# Patient Record
Sex: Male | Born: 1970
Health system: Southern US, Community
[De-identification: ages and names within clinical notes are randomized; demographics above are authoritative.]

## PROBLEM LIST (undated history)

## (undated) DIAGNOSIS — R030 Elevated blood-pressure reading, without diagnosis of hypertension: Secondary | ICD-10-CM

## (undated) DIAGNOSIS — E7800 Pure hypercholesterolemia, unspecified: Secondary | ICD-10-CM

## (undated) DIAGNOSIS — Z720 Tobacco use: Secondary | ICD-10-CM

## (undated) HISTORY — DX: Pure hypercholesterolemia, unspecified: E78.00

## (undated) HISTORY — DX: Elevated blood-pressure reading, without diagnosis of hypertension: R03.0

## (undated) HISTORY — DX: Tobacco use: Z72.0

## (undated) HISTORY — PX: NO PAST SURGERIES: SHX2092

---

## 2000-04-02 ENCOUNTER — Emergency Department (HOSPITAL_COMMUNITY): Admission: EM | Admit: 2000-04-02 | Discharge: 2000-04-02 | Payer: Self-pay | Admitting: Emergency Medicine

## 2005-08-24 ENCOUNTER — Inpatient Hospital Stay: Payer: Self-pay | Admitting: Internal Medicine

## 2006-04-10 ENCOUNTER — Emergency Department: Payer: Self-pay | Admitting: Emergency Medicine

## 2008-07-07 ENCOUNTER — Emergency Department: Payer: Self-pay | Admitting: Emergency Medicine

## 2012-10-31 ENCOUNTER — Emergency Department: Payer: Self-pay | Admitting: Emergency Medicine

## 2016-03-01 ENCOUNTER — Emergency Department: Payer: Self-pay

## 2016-03-01 ENCOUNTER — Emergency Department
Admission: EM | Admit: 2016-03-01 | Discharge: 2016-03-01 | Disposition: A | Discharge status: Home / Self Care | Payer: Self-pay | Attending: Emergency Medicine | Admitting: Emergency Medicine

## 2016-03-01 DIAGNOSIS — Y9389 Activity, other specified: Secondary | ICD-10-CM | POA: Insufficient documentation

## 2016-03-01 DIAGNOSIS — W208XXA Other cause of strike by thrown, projected or falling object, initial encounter: Secondary | ICD-10-CM | POA: Insufficient documentation

## 2016-03-01 DIAGNOSIS — Y999 Unspecified external cause status: Secondary | ICD-10-CM | POA: Insufficient documentation

## 2016-03-01 DIAGNOSIS — S60212A Contusion of left wrist, initial encounter: Secondary | ICD-10-CM | POA: Insufficient documentation

## 2016-03-01 DIAGNOSIS — Y929 Unspecified place or not applicable: Secondary | ICD-10-CM | POA: Insufficient documentation

## 2016-03-01 MED ORDER — OXYCODONE-ACETAMINOPHEN 5-325 MG PO TABS: 1.0000 | ORAL_TABLET | ORAL | 0 refills | Status: DC | PRN

## 2016-03-01 MED ORDER — OXYCODONE-ACETAMINOPHEN 5-325 MG PO TABS
1.0000 | ORAL_TABLET | Freq: Once | ORAL | Status: AC
  Administered 2016-03-01: 1 via ORAL
  Filled 2016-03-01: qty 1

## 2016-03-01 NOTE — ED Triage Notes (Signed)
Patient ambulatory to triage with steady gait, without difficulty or distress noted; pt reports injuring left wrist moving a dresser PTA; swelling noted

## 2016-03-01 NOTE — ED Provider Notes (Signed)
[facility] Emergency Department Provider Note  ____________________________________________   First MD Initiated Contact with Patient 03/01/16 [phone]     (approximate)  I have reviewed the triage vital signs and the nursing notes.   HISTORY  Chief Complaint Wrist Injury    HPI Bill Hernandez is a 45 y.o. male presents with left wrist pain swelling status post injury while moving a dresser. Patient states that the draw in the dresser fell onto his left wrist with resultant pain and swelling. Patient states his current pain score is 9 out of 10 worse with movement.   Past medical history None There are no active problems to display for this patient.   Past surgical history None  Prior to Admission medications   Not on File    Allergies Review of patient's allergies indicates no known allergies.  No family history on file.  Social History Social History  Substance Use Topics  . Smoking status: Not on file  . Smokeless tobacco: Not on file  . Alcohol use Not on file    Review of Systems Constitutional: No fever/chills Eyes: No visual changes. ENT: No sore throat. Cardiovascular: Denies chest pain. Respiratory: Denies shortness of breath. Gastrointestinal: No abdominal pain.  No nausea, no vomiting.  No diarrhea.  No constipation. Genitourinary: Negative for dysuria. Musculoskeletal: Negative for back pain.Positive for left wrist pain and swelling Skin: Negative for rash. Neurological: Negative for headaches, focal weakness or numbness.  10-point ROS otherwise negative.  ____________________________________________   PHYSICAL EXAM:  VITAL SIGNS: ED Triage Vitals  Enc Vitals Group     BP 03/01/16 0201 (!) 153/86     Pulse Rate 03/01/16 0201 92     Resp 03/01/16 0201 20     Temp 03/01/16 0201 99 F (37.2 C)     Temp Source 03/01/16 0201 Oral     SpO2 03/01/16 0201 99 %     Weight 03/01/16 0144 200 lb (90.7 kg)   Height 03/01/16 0144 6' (1.829 m)     Head Circumference --      Peak Flow --      Pain Score 03/01/16 0144 9     Pain Loc --      Pain Edu? --      Excl. in GC? --     Constitutional: Alert and oriented. Well appearing and in no acute distress. Eyes: Conjunctivae are normal. PERRL. EOMI. Head: Atraumatic. Mouth/Throat: Mucous membranes are moist.  Oropharynx non-erythematous. Neck: No stridor.  No meningeal signs.  Cardiovascular: Normal rate, regular rhythm. Good peripheral circulation. Grossly normal heart sounds. Respiratory: Normal respiratory effort.  No retractions. Lungs CTAB. Gastrointestinal: Soft and nontender. No distention.  Musculoskeletal: No lower extremity tenderness nor edema. No gross deformities of extremities.Left wrist abrasion swelling tenderness to palpation. Tenderness with palpation of the anatomical snuffbox Neurologic:  Normal speech and language. No gross focal neurologic deficits are appreciated.  Skin:  Skin is warm, dry and intact. No rash noted. Psychiatric: Mood and affect are normal. Speech and behavior are normal.   RADIOLOGY I,  N Hady Niemczyk, personally viewed and evaluated these images (plain radiographs) as part of my medical decision making, as well as reviewing the written report by the radiologist. Dg Wrist Complete Left  Result Date: 03/01/2016 CLINICAL DATA:  45 y/o  M; left wrist injury moving a dresser. EXAM: LEFT WRIST - COMPLETE 3+ VIEW COMPARISON:  None. FINDINGS: There is no evidence of fracture or dislocation. There is no evidence of  arthropathy or other focal bone abnormality. Soft tissues are unremarkable. IMPRESSION: Negative. Electronically Signed   By: Mitzi HansenLance  Furusawa-Stratton M.D.   On: 03/01/2016 02:27     Procedures     INITIAL IMPRESSION / ASSESSMENT AND PLAN / ED COURSE  Pertinent labs & imaging results that were available during my care of the patient were reviewed by me and considered in my medical decision making  (see chart for details).  Patient given a Percocet. Velcro wrist splint applied by ED tech.I encouraged patient to follow up with Dr. Truett PernaMentz given possibly of occult fracture of the scaphoid.   Clinical Course    ____________________________________________  FINAL CLINICAL IMPRESSION(S) / ED DIAGNOSES  Final diagnoses:  Contusion of left wrist, initial encounter     MEDICATIONS GIVEN DURING THIS VISIT:  Medications - No data to display   NEW OUTPATIENT MEDICATIONS STARTED DURING THIS VISIT:  New Prescriptions   No medications on file      Note:  This document was prepared using Dragon voice recognition software and may include unintentional dictation errors.    Darci Currentandolph N Kristian Mogg, MD 03/01/16 [phone]

## 2016-09-28 ENCOUNTER — Encounter: Payer: Self-pay | Admitting: Nurse Practitioner

## 2016-09-28 ENCOUNTER — Ambulatory Visit (INDEPENDENT_AMBULATORY_CARE_PROVIDER_SITE_OTHER): Payer: Managed Care, Other (non HMO) | Admitting: Nurse Practitioner

## 2016-09-28 VITALS — BP 132/92 | HR 85 | Temp 97.9°F | Resp 16 | Ht 72.0 in | Wt 194.0 lb

## 2016-09-28 DIAGNOSIS — Z7689 Persons encountering health services in other specified circumstances: Secondary | ICD-10-CM

## 2016-09-28 DIAGNOSIS — Z0000 Encounter for general adult medical examination without abnormal findings: Secondary | ICD-10-CM

## 2016-09-28 DIAGNOSIS — Z720 Tobacco use: Secondary | ICD-10-CM | POA: Diagnosis not present

## 2016-09-28 DIAGNOSIS — R030 Elevated blood-pressure reading, without diagnosis of hypertension: Secondary | ICD-10-CM | POA: Diagnosis not present

## 2016-09-28 HISTORY — DX: Tobacco use: Z72.0

## 2016-09-28 LAB — CBC WITH DIFFERENTIAL/PLATELET
Basophils Absolute: 0 cells/uL (ref 0–200)
Basophils Relative: 0 %
Eosinophils Absolute: 261 cells/uL (ref 15–500)
Eosinophils Relative: 3 %
HCT: 43.5 % (ref 38.5–50.0)
Hemoglobin: 14.4 g/dL (ref 13.2–17.1)
Lymphocytes Relative: 32 %
Lymphs Abs: 2784 cells/uL (ref 850–3900)
MCH: 27.1 pg (ref 27.0–33.0)
MCHC: 33.1 g/dL (ref 32.0–36.0)
MCV: 81.8 fL (ref 80.0–100.0)
MPV: 11.5 fL (ref 7.5–12.5)
Monocytes Absolute: 609 cells/uL (ref 200–950)
Monocytes Relative: 7 %
Neutro Abs: 5046 cells/uL (ref 1500–7800)
Neutrophils Relative %: 58 %
Platelets: 194 10*3/uL (ref 140–400)
RBC: 5.32 MIL/uL (ref 4.20–5.80)
RDW: 13.1 % (ref 11.0–15.0)
WBC: 8.7 10*3/uL (ref 3.8–10.8)

## 2016-09-28 LAB — LIPID PANEL
Cholesterol: 196 mg/dL (ref <200)
HDL: 44 mg/dL (ref >40)
LDL Cholesterol: 122 mg/dL — ABNORMAL HIGH (ref <100)
Total CHOL/HDL Ratio: 4.5 Ratio (ref <5.0)
Triglycerides: 152 mg/dL — ABNORMAL HIGH (ref <150)
VLDL: 30 mg/dL (ref <30)

## 2016-09-28 LAB — COMPLETE METABOLIC PANEL WITH GFR
ALT: 21 U/L (ref 9–46)
AST: 24 U/L (ref 10–40)
Albumin: 4.8 g/dL (ref 3.6–5.1)
Alkaline Phosphatase: 67 U/L (ref 40–115)
BUN: 14 mg/dL (ref 7–25)
CO2: 26 mmol/L (ref 20–31)
Calcium: 9.3 mg/dL (ref 8.6–10.3)
Chloride: 105 mmol/L (ref 98–110)
Creat: 1.02 mg/dL (ref 0.60–1.35)
GFR, Est African American: >89 mL/min (ref >=60)
GFR, Est Non African American: 87 mL/min (ref >=60)
Glucose, Bld: 85 mg/dL (ref 65–99)
Potassium: 3.9 mmol/L (ref 3.5–5.3)
Sodium: 140 mmol/L (ref 135–146)
Total Bilirubin: 0.6 mg/dL (ref 0.2–1.2)
Total Protein: 7.2 g/dL (ref 6.1–8.1)

## 2016-09-28 LAB — TSH: TSH: 1.6 mIU/L (ref 0.40–4.50)

## 2016-09-28 NOTE — Progress Notes (Signed)
I have reviewed this encounter including the documentation in this note and/or discussed this patient with the provider, Wilhelmina McardleLauren Kennedy, AGPCNP-BC. I am certifying that I agree with the content of this note as supervising physician.  Saralyn PilarAlexander Mariella Blackwelder, DO [facility] 09/28/2016, 3:13 PM

## 2016-09-28 NOTE — Patient Instructions (Signed)
Bill Hernandez, Thank you for coming in to clinic today.  1. Annual Physical - We have sent blood work today.  - Return in 1 year for your next annual physical.  2. High Blood Pressure - Your blood pressure goals are less than 130/90.  Check your BP daily for 1 week at home.  Then check it once per week and see if your diet changes help to lower your BP. - Start making some diet changes.  Following the DASH diet will help lower your blood pressure.  Choose one or two things to change this week.  Then keep adding them until your diet is mostly like the DASH diet all the time.  Please schedule a follow-up appointment with Wilhelmina Mcardle, AGNP in 1 year.  If you have any other questions or concerns, please feel free to call the clinic or send a message through MyChart. You may also schedule an earlier appointment if necessary.  Wilhelmina Mcardle, DNP, AGNP-BC Adult Gerontology Nurse Practitioner [facility]    DASH Eating Plan DASH stands for "Dietary Approaches to Stop Hypertension." The DASH eating plan is a healthy eating plan that has been shown to reduce high blood pressure (hypertension). It may also reduce your risk for type 2 diabetes, heart disease, and stroke. The DASH eating plan may also help with weight loss. What are tips for following this plan? General guidelines   Avoid eating more than 2,300-2,500 mg (milligrams) of salt (sodium) a day. If you have hypertension, you may need to reduce your sodium intake to 1,500 mg a day.  Limit alcohol intake to no more than 1 drink a day for nonpregnant women and 2 drinks a day for men. One drink equals 12 oz of beer, 5 oz of wine, or 1 oz of hard liquor.  Work with your health care provider to maintain a healthy body weight or to lose weight. Ask what an ideal weight is for you.  Get at least 30 minutes of exercise that causes your heart to beat faster (aerobic exercise) most days of the week. Activities may include  walking, swimming, or biking.  Work with your health care provider or diet and nutrition specialist (dietitian) to adjust your eating plan to your individual calorie needs. Reading food labels   Check food labels for the amount of sodium per serving. Choose foods with less than 5 percent of the Daily Value of sodium. Generally, foods with less than 300 mg of sodium per serving fit into this eating plan.  To find whole grains, look for the word "whole" as the first word in the ingredient list. Shopping   Buy products labeled as "low-sodium" or "no salt added."  Buy fresh foods. Avoid canned foods and premade or frozen meals. Cooking   Avoid adding salt when cooking. Use salt-free seasonings or herbs instead of table salt or sea salt. Check with your health care provider or pharmacist before using salt substitutes.  Do not fry foods. Cook foods using healthy methods such as baking, boiling, grilling, and broiling instead.  Cook with heart-healthy oils, such as olive, canola, soybean, or sunflower oil. Meal planning    Eat a balanced diet that includes:  5 or more servings of fruits and vegetables each day. At each meal, try to fill half of your plate with fruits and vegetables.  Up to 6-8 servings of whole grains each day.  Less than 6 oz of lean meat, poultry, or fish each day. A 3-oz serving  of meat is about the same size as a deck of cards. One egg equals 1 oz.  2 servings of low-fat dairy each day.  A serving of nuts, seeds, or beans 5 times each week.  Heart-healthy fats. Healthy fats called Omega-3 fatty acids are found in foods such as flaxseeds and coldwater fish, like sardines, salmon, and mackerel.  Limit how much you eat of the following:  Canned or prepackaged foods.  Food that is high in trans fat, such as fried foods.  Food that is high in saturated fat, such as fatty meat.  Sweets, desserts, sugary drinks, and other foods with added sugar.  Full-fat dairy  products.  Do not salt foods before eating.  Try to eat at least 2 vegetarian meals each week.  Eat more home-cooked food and less restaurant, buffet, and fast food.  When eating at a restaurant, ask that your food be prepared with less salt or no salt, if possible. What foods are recommended? The items listed may not be a complete list. Talk with your dietitian about what dietary choices are best for you. Grains  Whole-grain or whole-wheat bread. Whole-grain or whole-wheat pasta. Brown rice. Orpah Cobb. Bulgur. Whole-grain and low-sodium cereals. Pita bread. Low-fat, low-sodium crackers. Whole-wheat flour tortillas. Vegetables  Fresh or frozen vegetables (raw, steamed, roasted, or grilled). Low-sodium or reduced-sodium tomato and vegetable juice. Low-sodium or reduced-sodium tomato sauce and tomato paste. Low-sodium or reduced-sodium canned vegetables. Fruits  All fresh, dried, or frozen fruit. Canned fruit in natural juice (without added sugar). Meat and other protein foods  Skinless chicken or [place]. Ground chicken or [place]. Pork with fat trimmed off. Fish and seafood. Egg whites. Dried beans, peas, or lentils. Unsalted nuts, nut butters, and seeds. Unsalted canned beans. Lean cuts of beef with fat trimmed off. Low-sodium, lean deli meat. Dairy  Low-fat (1%) or fat-free (skim) milk. Fat-free, low-fat, or reduced-fat cheeses. Nonfat, low-sodium ricotta or cottage cheese. Low-fat or nonfat yogurt. Low-fat, low-sodium cheese. Fats and oils  Soft margarine without trans fats. Vegetable oil. Low-fat, reduced-fat, or light mayonnaise and salad dressings (reduced-sodium). Canola, safflower, olive, soybean, and sunflower oils. Avocado. Seasoning and other foods  Herbs. Spices. Seasoning mixes without salt. Unsalted popcorn and pretzels. Fat-free sweets. What foods are not recommended? The items listed may not be a complete list. Talk with your dietitian about what dietary choices are best  for you. Grains  Baked goods made with fat, such as croissants, muffins, or some breads. Dry pasta or rice meal packs. Vegetables  Creamed or fried vegetables. Vegetables in a cheese sauce. Regular canned vegetables (not low-sodium or reduced-sodium). Regular canned tomato sauce and paste (not low-sodium or reduced-sodium). Regular tomato and vegetable juice (not low-sodium or reduced-sodium). Rosita Fire. Olives. Fruits  Canned fruit in a light or heavy syrup. Fried fruit. Fruit in cream or butter sauce. Meat and other protein foods  Fatty cuts of meat. Ribs. Fried meat. Tomasa Blase. Sausage. Bologna and other processed lunch meats. Salami. Fatback. Hotdogs. Bratwurst. Salted nuts and seeds. Canned beans with added salt. Canned or smoked fish. Whole eggs or egg yolks. Chicken or [place] with skin. Dairy  Whole or 2% milk, cream, and half-and-half. Whole or full-fat cream cheese. Whole-fat or sweetened yogurt. Full-fat cheese. Nondairy creamers. Whipped toppings. Processed cheese and cheese spreads. Fats and oils  Butter. Stick margarine. Lard. Shortening. Ghee. Bacon fat. Tropical oils, such as coconut, palm kernel, or palm oil. Seasoning and other foods  Salted popcorn and pretzels. Onion salt, garlic  salt, seasoned salt, table salt, and sea salt. Worcestershire sauce. Tartar sauce. Barbecue sauce. Teriyaki sauce. Soy sauce, including reduced-sodium. Steak sauce. Canned and packaged gravies. Fish sauce. Oyster sauce. Cocktail sauce. Horseradish that you find on the shelf. Ketchup. Mustard. Meat flavorings and tenderizers. Bouillon cubes. Hot sauce and Tabasco sauce. Premade or packaged marinades. Premade or packaged taco seasonings. Relishes. Regular salad dressings. Where to find more information:  National Heart, Lung, and Blood Institute: PopSteam.iswww.nhlbi.nih.gov  American Heart Association: www.heart.org Summary  The DASH eating plan is a healthy eating plan that has been shown to reduce high blood  pressure (hypertension). It may also reduce your risk for type 2 diabetes, heart disease, and stroke.  With the DASH eating plan, you should limit salt (sodium) intake to 2,300 mg a day. If you have hypertension, you may need to reduce your sodium intake to 1,500 mg a day.  When on the DASH eating plan, aim to eat more fresh fruits and vegetables, whole grains, lean proteins, low-fat dairy, and heart-healthy fats.  Work with your health care provider or diet and nutrition specialist (dietitian) to adjust your eating plan to your individual calorie needs. This information is not intended to replace advice given to you by your health care provider. Make sure you discuss any questions you have with your health care provider. Document Released: 06/15/2011 Document Revised: 06/19/2016 Document Reviewed: 06/19/2016 Elsevier Interactive Patient Education  2017 [employer]

## 2016-09-28 NOTE — Progress Notes (Signed)
Subjective:    Patient ID: Bill Hernandez, male    DOB: 10-31-1970, 46 y.o.   MRN: [id number]  Bill MajesticKendrick E Hegna is a 46 y.o. male presenting on 09/28/2016 for Establish Care (as per pt wants physical)   HPI Bill Hernandez is presenting today for establishing care with a new provider and to obtain an annual physical exam with documentation for his employer.  He reports no acute concerns.  Hypertension Pt states he took blood pressure medicine for one month about 10-20 years ago.  He went back to follow-up and was taken off of it.  He admits his blood pressure was not very high then and that he "doesn't have high blood pressure anymore" because he was able to "wean off his medicine."  He also states he made some lifestyle modifications then.  Today, Mr. Bill Hernandez has had two high blood pressure readings.  One on initial measurement of 149/98 and one later in the visit that was 132/92.  He does not check his BP at home.  Mr. Bill Hernandez said he had just smoked a cigarette before coming in and that might have made his BP higher for the first reading.  He also has recently divorced and acknowledges that the stress might be making his BP higher.       He does not have headache, dizziness, near syncope, vision changes, or any chest pain/tightness/pressure.  Skin Lesion The skin lesion noted in the physical exam below has been present for "a long time" and the patient doesn't know when it started.  He has never had redness, localized fever, pain, or tenderness at the site above his anterior hip.  History reviewed. No pertinent past medical history. History reviewed. No pertinent surgical history. Social History   Social History  . Marital status: Single    Spouse name: N/A  . Number of children: N/A  . Years of education: N/A   Occupational History  . Not on file.   Social History Main Topics  . Smoking status: Current Every Day Smoker    Packs/day: 0.50    Types: Cigarettes  .  Smokeless tobacco: Current User     Comment: 8-9 cigarettes per day  . Alcohol use 1.2 - 1.8 oz/week    2 - 3 Cans of beer per week  . Drug use: Yes    Types: Marijuana     Comment: every 2-4 weeks  . Sexual activity: Yes Multiple partners.  Uprotected sex.    Social History Narrative  . No narrative on file   Family History  Problem Relation Age of Onset  . Hypertension Mother   . Hyperlipidemia Mother    No current outpatient prescriptions on file prior to visit.   No current facility-administered medications on file prior to visit.     Review of Systems  Constitutional: Negative.   HENT: Positive for rhinorrhea.   Eyes: Negative.   Respiratory: Negative.   Cardiovascular: Negative.   Gastrointestinal: Negative.   Endocrine: Negative.   Genitourinary: Negative.   Musculoskeletal: Negative.   Skin: Negative.   Allergic/Immunologic: Positive for environmental allergies.       Workplace and some seasonal  Neurological: Negative.   Hematological: Negative.   Psychiatric/Behavioral: Negative.      Per HPI unless specifically indicated above    Objective:    BP (!) 132/92 (BP Location: Left Arm, Patient Position: Sitting)   Pulse 85   Temp 97.9 F (36.6 C) (Other (Comment))   Resp 16  Ht 6' (1.829 m)   Wt 194 lb (88 kg)   BMI 26.31 kg/m   Wt Readings from Last 3 Encounters:  09/28/16 194 lb (88 kg)  03/01/16 200 lb (90.7 kg)    Depression screen PHQ 2/9 09/28/2016  Decreased Interest 0  Down, Depressed, Hopeless 0  PHQ - 2 Score 0   Physical Exam  Constitutional: He is oriented to person, place, and time. He appears well-developed and well-nourished. No distress.  HENT:  Head: Normocephalic and atraumatic.  Eyes: Conjunctivae and EOM are normal. Pupils are equal, round, and reactive to light.  Neck: Normal range of motion. Neck supple.  Cardiovascular: Normal rate, regular rhythm, normal heart sounds and intact distal pulses.   Pulmonary/Chest:  Effort normal and breath sounds normal.  Abdominal: Soft. Bowel sounds are normal. There is no hepatosplenomegaly. There is no tenderness. Hernia confirmed negative in the right inguinal area and confirmed negative in the left inguinal area.  Genitourinary: Testes normal and penis normal.    Cremasteric reflex is present. Circumcised.  Musculoskeletal: Normal range of motion.  Lymphadenopathy:       Right: No inguinal adenopathy present.       Left: No inguinal adenopathy present.  Neurological: He is alert and oriented to person, place, and time. He has normal reflexes. No cranial nerve deficit.  Skin: Skin is warm and dry. No cyanosis. Nails show no clubbing.     Psychiatric: He has a normal mood and affect. His speech is normal and behavior is normal. Judgment and thought content normal.      Assessment & Plan:   Problem List Items Addressed This Visit    None    Visit Diagnoses    Establishing care with new doctor, encounter for    -  Primary   Annual physical exam     1. Obtain labs below. 2. Counseled on sexual activity and practices r/t risk for STDs.  Patient declined STD screening today.  Reviewed importance of condom use for reducing STD risk.  Reviewed signs and symptoms of syphillis chancre, penile drainage, and genital warts so patient can follow-up for screening. 3. Encouraged continued use of workplace safety and proper body mechanics.   Relevant Orders   COMPLETE METABOLIC PANEL WITH GFR   Lipid Profile   TSH   CBC with Differential   Tobacco abuse     1. Not willing to quit at this time. 2. Revisit tobacco abuse at next visit.    Blood pressure increased diastolic     1. Start home BP readings at least once per week and review at next clinic visit. 2. Start DASH diet modifications. 3. Follow-up 3 months for BP check.  Consider medication management and/or smoking cessation if further treatment needed.        Follow up plan: Return in about 3 months  (around 12/29/2016) for blood pressure follow-up and 1 year for annual physical.  Bill Mcardle, DNP, AGNP-BC Adult Gerontology Nurse Practitioner [facility] 09/28/2016, 12:26 PM

## 2016-10-30 ENCOUNTER — Encounter: Payer: Self-pay | Admitting: Nurse Practitioner

## 2016-10-30 ENCOUNTER — Ambulatory Visit (INDEPENDENT_AMBULATORY_CARE_PROVIDER_SITE_OTHER): Payer: Managed Care, Other (non HMO) | Admitting: Nurse Practitioner

## 2016-10-30 VITALS — BP 114/74 | HR 81 | Temp 98.3°F | Resp 16 | Ht 72.0 in | Wt 192.4 lb

## 2016-10-30 DIAGNOSIS — Z720 Tobacco use: Secondary | ICD-10-CM | POA: Diagnosis not present

## 2016-10-30 DIAGNOSIS — E785 Hyperlipidemia, unspecified: Secondary | ICD-10-CM | POA: Insufficient documentation

## 2016-10-30 DIAGNOSIS — Z716 Tobacco abuse counseling: Secondary | ICD-10-CM | POA: Diagnosis not present

## 2016-10-30 DIAGNOSIS — E7800 Pure hypercholesterolemia, unspecified: Secondary | ICD-10-CM

## 2016-10-30 DIAGNOSIS — E1169 Type 2 diabetes mellitus with other specified complication: Secondary | ICD-10-CM | POA: Insufficient documentation

## 2016-10-30 HISTORY — DX: Pure hypercholesterolemia, unspecified: E78.00

## 2016-10-30 NOTE — Patient Instructions (Addendum)
Bill Hernandez, Thank you for coming in to clinic today.  1. Stop smoking.  Fill work break time with something different.  This will reduce your risk of heart attack and stroke from high cholesterol as much as taking medication will. If you are not able to quit on your own, we can discuss medication with Wellbutrin or Chantix.  Call us to schedule this appointment when you are ready.  2.   For your cholesterol: continue your lifestyle modifications with what you eat (your diet) and cutting back fried foods.  Pick more healthy snacks - think higher fiber, healthy fats (nuts), and veggies/fruits.    HDL is your good cholesterol.  When it is higher than 50, it can be protective against and prevent cholesterol buildup in your arteries that could lead to heart attack or stroke.  You can increase your HDL by exercising, by taking fish oil 1,000 mg daily or by increasing your dietary omega-3 fatty acids.  Omega-3s are found in olive oil, fish, and seeds and nuts like almonds, pistachios, walnuts, and pecans.    Please schedule a follow-up appointment with Wilhelmina Mcardle, AGNP in 5 months.  If you have any other questions or concerns, please feel free to call the clinic or send a message through MyChart. You may also schedule an earlier appointment if necessary.  Wilhelmina Mcardle, DNP, AGNP-BC Adult Gerontology Nurse Practitioner [facility], [facility]    Coping with Quitting Smoking Quitting smoking is a physical and mental challenge. You will face cravings, withdrawal symptoms, and temptation. Before quitting, work with your health care provider to make a plan that can help you cope. Preparation can help you quit and keep you from giving in. How can I cope with cravings? Cravings usually last for 5-10 minutes. If you get through it, the craving will pass. Consider taking the following actions to help you cope with cravings:  Keep your mouth busy:  Chew sugar-free gum.  Suck on hard candies  or a straw.  Brush your teeth.  Keep your hands and body busy:  Immediately change to a different activity when you feel a craving.  Squeeze or play with a ball.  Do an activity or a hobby, like making bead jewelry, practicing needlepoint, or working with wood.  Mix up your normal routine.  Take a short exercise break. Go for a quick walk or run up and down stairs.  Spend time in public places where smoking is not allowed.  Focus on doing something kind or helpful for someone else.  Call a friend or family member to talk during a craving.  Join a support group.  Call a quit line, such as 1-800-QUIT-NOW.  Talk with your health care provider about medicines that might help you cope with cravings and make quitting easier for you. How can I deal with withdrawal symptoms? Your body may experience negative effects as it tries to get used to not having nicotine in the system. These effects are called withdrawal symptoms. They may include:  Feeling hungrier than normal.  Trouble concentrating.  Irritability.  Trouble sleeping.  Feeling depressed.  Restlessness and agitation.  Craving a cigarette. 1.  To manage withdrawal symptoms:  Avoid places, people, and activities that trigger your cravings.  Remember why you want to quit.  Get plenty of sleep.  Avoid coffee and other caffeinated drinks. These may worsen some of your symptoms. How can I handle social situations? Social situations can be difficult when you are quitting smoking, especially  in the first few weeks. To manage this, you can:  Avoid parties, bars, and other social situations where people might be smoking.  Avoid alcohol.  Leave right away if you have the urge to smoke.  Explain to your family and friends that you are quitting smoking. Ask for understanding and support.  Plan activities with friends or family where smoking is not an option. What are some ways I can cope with stress? Wanting to  smoke may cause stress, and stress can make you want to smoke. Find ways to manage your stress. Relaxation techniques can help. For example:  Breathe slowly and deeply, in through your nose and out through your mouth.  Listen to soothing, relaxing music.  Talk with a family member or friend about your stress.  Light a candle.  Soak in a bath or take a shower.  Think about a peaceful place. What are some ways I can prevent weight gain? Be aware that many people gain weight after they quit smoking. However, not everyone does. To keep from gaining weight, have a plan in place before you quit and stick to the plan after you quit. Your plan should include:  Having healthy snacks. When you have a craving, it may help to:  Eat plain popcorn, crunchy carrots, celery, or other cut vegetables.  Chew sugar-free gum.  Changing how you eat:  Eat small portion sizes at meals.  Eat 4-6 small meals throughout the day instead of 1-2 large meals a day.  Be mindful when you eat. Do not watch television or do other things that might distract you as you eat.  Exercising regularly:  Make time to exercise each day. If you do not have time for a long workout, do short bouts of exercise for 5-10 minutes several times a day.  Do some form of strengthening exercise, like weight lifting, and some form of aerobic exercise, like running or swimming.  Drinking plenty of water or other low-calorie or no-calorie drinks. Drink 6-8 glasses of water daily, or as much as instructed by your health care provider. Summary  Quitting smoking is a physical and mental challenge. You will face cravings, withdrawal symptoms, and temptation to smoke again. Preparation can help you as you go through these challenges.  You can cope with cravings by keeping your mouth busy (such as by chewing gum), keeping your body and hands busy, and making calls to family, friends, or a helpline for people who want to quit smoking.  You  can cope with withdrawal symptoms by avoiding places where people smoke, avoiding drinks with caffeine, and getting plenty of rest.  Ask your health care provider about the different ways to prevent weight gain, avoid stress, and handle social situations. This information is not intended to replace advice given to you by your health care provider. Make sure you discuss any questions you have with your health care provider. Document Released: 06/23/2016 Document Revised: 06/23/2016 Document Reviewed: 06/23/2016 Elsevier Interactive Patient Education  2017 [employer].

## 2016-10-30 NOTE — Progress Notes (Signed)
Subjective:    Patient ID: Bill Hernandez, male    DOB: 09/18/1970, 46 y.o.   MRN: [id number]  Bill Hernandez is a 46 y.o. male presenting on 10/30/2016 for Follow-up (Patient here today to fu on BP and cholesterol. Patient last ov was on 09/28/2016. Patient had labs checked and was advised to follow-up in one month. Patient reports not checking BP at home, and reports being active at work. Patient reports he does follow DASH diet. )  HPI   Smoking Cessation Since his last visit, he has cut back to 2-3 cigarettes per day from 5-8 cigarettes. He verbalizes that his goal is to stop smoking completely because he does want to improve his overall health.  He is used to smoking on his work breaks and uses it to fill his break time.  He is mandated to take 2-3 breaks every work shift.    He is willing to consider medication, but wants to try to quit on his own first.  Lifestyle Modification for Cholesterol and BP management He does not currently check blood pressure at home.  His last clinic reading was 132/92 on 09/28/2016.    Since his last visit, he has reviewed the DASH diet and has taken the salt shaker off his table.  He has cut back on fried foods and is baking foods at home.  Last night he had a baked pork chop. He has also added more veggies.  He notes he does still make poor choices with snacking.  He is choosing chips or something else from the vending machine.  He states his next goal for dietary choices is to consider better snacks.  Physical activity outside of work is minimal, but he does mow his yard with a push mower and this activity gets his heart rate up.    Social History  Substance Use Topics  . Smoking status: Current Every Day Smoker    Packs/day: 0.25    Types: Cigarettes  . Smokeless tobacco: Current User     Comment: Down to 2-3 cigarettes per day from 8-9 cigarettes per day  . Alcohol use 1.2 - 1.8 oz/week    2 - 3 Cans of beer per week    Review of  Systems Per HPI unless specifically indicated above    Objective:    BP 114/74 (BP Location: Left Arm, Patient Position: Sitting, Cuff Size: Large)   Pulse 81   Temp 98.3 F (36.8 C) (Oral)   Resp 16   Ht 6' (1.829 m)   Wt 192 lb 6.4 oz (87.3 kg)   SpO2 100%   BMI 26.09 kg/m    Wt Readings from Last 3 Encounters:  10/30/16 192 lb 6.4 oz (87.3 kg)  09/28/16 194 lb (88 kg)  03/01/16 200 lb (90.7 kg)    Physical Exam  Constitutional: He is oriented to person, place, and time. He appears well-developed. No distress.  HENT:  Head: Normocephalic and atraumatic.  Right Ear: External ear normal.  Left Ear: External ear normal.  Eyes: Conjunctivae are normal. Pupils are equal, round, and reactive to light.  Arcus senilis bilateral eyes  Neck: Normal range of motion. Neck supple. No JVD present. No tracheal deviation present. No thyromegaly present.  Cardiovascular: Normal rate, regular rhythm, normal heart sounds and intact distal pulses.   Negative carotid bruits  Musculoskeletal: He exhibits no edema.  Lymphadenopathy:    He has no cervical adenopathy.  Neurological: He is alert and oriented to person,  place, and time.  Skin: Skin is warm and dry.  Psychiatric: He has a normal mood and affect. His behavior is normal.     Results for orders placed or performed in visit on 09/28/16  COMPLETE METABOLIC PANEL WITH GFR  Result Value Ref Range   Sodium 140 135 - 146 mmol/L   Potassium 3.9 3.5 - 5.3 mmol/L   Chloride 105 98 - 110 mmol/L   CO2 26 20 - 31 mmol/L   Glucose, Bld 85 65 - 99 mg/dL   BUN 14 7 - 25 mg/dL   Creat 1.02 0.60 - 1.35 mg/dL   Total Bilirubin 0.6 0.2 - 1.2 mg/dL   Alkaline Phosphatase 67 40 - 115 U/L   AST 24 10 - 40 U/L   ALT 21 9 - 46 U/L   Total Protein 7.2 6.1 - 8.1 g/dL   Albumin 4.8 3.6 - 5.1 g/dL   Calcium 9.3 8.6 - 10.3 mg/dL   GFR, Est African American >89 >=60 mL/min   GFR, Est Non African American 87 >=60 mL/min  Lipid Profile  Result Value  Ref Range   Cholesterol 196 <200 mg/dL   Triglycerides 152 (H) <150 mg/dL   HDL 44 >40 mg/dL   Total CHOL/HDL Ratio 4.5 <5.0 Ratio   VLDL 30 <30 mg/dL   LDL Cholesterol 122 (H) <100 mg/dL  TSH  Result Value Ref Range   TSH 1.60 0.40 - 4.50 mIU/L  CBC with Differential  Result Value Ref Range   WBC 8.7 3.8 - 10.8 K/uL   RBC 5.32 4.20 - 5.80 MIL/uL   Hemoglobin 14.4 13.2 - 17.1 g/dL   HCT 43.5 38.5 - 50.0 %   MCV 81.8 80.0 - 100.0 fL   MCH 27.1 27.0 - 33.0 pg   MCHC 33.1 32.0 - 36.0 g/dL   RDW 13.1 11.0 - 15.0 %   Platelets 194 140 - 400 K/uL   MPV 11.5 7.5 - 12.5 fL   Neutro Abs 5,046 1,500 - 7,800 cells/uL   Lymphs Abs 2,784 850 - 3,900 cells/uL   Monocytes Absolute 609 200 - 950 cells/uL   Eosinophils Absolute 261 15 - 500 cells/uL   Basophils Absolute 0 0 - 200 cells/uL   Neutrophils Relative % 58 %   Lymphocytes Relative 32 %   Monocytes Relative 7 %   Eosinophils Relative 3 %   Basophils Relative 0 %   Smear Review Criteria for review not met      Assessment & Plan:   Problem List Items Addressed This Visit      Other   Encounter for smoking cessation counseling    -  Primary Tobacco abuse Improving.  Reduction by 50% in 4 weeks.  Plan: 1. Continue reducing smoking.  Discussed tips to quit and information provided to patient. 2. Reviewed ASCVD risk scoring.  Pt is at a 6.9 % 10 year risk for cardiac event and at 50% lifetime risk. If initiate statin, 10-year risk becomes 5.2 %.  If stops smoking, 10-year risk becomes 5.0%.  Patient was surprised that smoking cessation would be as good as starting a medication for cholesterol management to prevent heart attack and stroke. 3. Goal set to stop smoking completely by next visit.  If assistance with pharmaceuticals is needed, pt will discuss at that visit or before.    Elevated low density lipoprotein (LDL) cholesterol level Stable.  Places patient at increased risk of ASCVD.  Plan: 1. Reviewed ASCVD scoring. 2.  Discussed statin  therapy.  Patient declines at this time. 3. Patient agrees to continue lifestyle modification with cholesterol recheck 6 months after his last check, which will be 5 months from today.        NEW GOALS:  - STOP smoking: Encouraged patient to set a stop date.  Plan for before next office visit.  - Continue lifestyle modification: Choose better snack choices.  Discussion today >10 minutes specifically on counseling on risks of tobacco use, complications, treatment, smoking cessation.  This includes ASCVD risk classifications, medication options for chantix or wellbutrin, and tips to stop smoking completely.  Follow up plan: Return in about 5 months (around 04/01/2017) for Cholesterol check, smoking check.   Cassell Smiles, DNP, AGPCNP-BC Adult Gerontology Primary Care Nurse Practitioner [facility] 10/31/2016, 3:55 PM

## 2016-10-31 NOTE — Progress Notes (Signed)
I have reviewed this encounter including the documentation in this note and/or discussed this patient with the provider, Wilhelmina Mcardle, AGPCNP-BC. I am certifying that I agree with the content of this note as supervising physician.  Saralyn Pilar, DO [facility] 10/31/2016, 6:08 PM

## 2017-01-04 ENCOUNTER — Ambulatory Visit: Payer: Managed Care, Other (non HMO) | Admitting: Nurse Practitioner

## 2017-01-29 ENCOUNTER — Ambulatory Visit: Payer: Managed Care, Other (non HMO) | Admitting: Nurse Practitioner

## 2017-02-13 IMAGING — CR DG WRIST COMPLETE 3+V*L*
4 series · 4 of 4 positions shown · non-contrast
Comparison: None.

CLINICAL DATA: 45 y/o  M; left wrist injury moving a dresser.

EXAM:
LEFT WRIST - COMPLETE 3+ VIEW

[wrist pa]
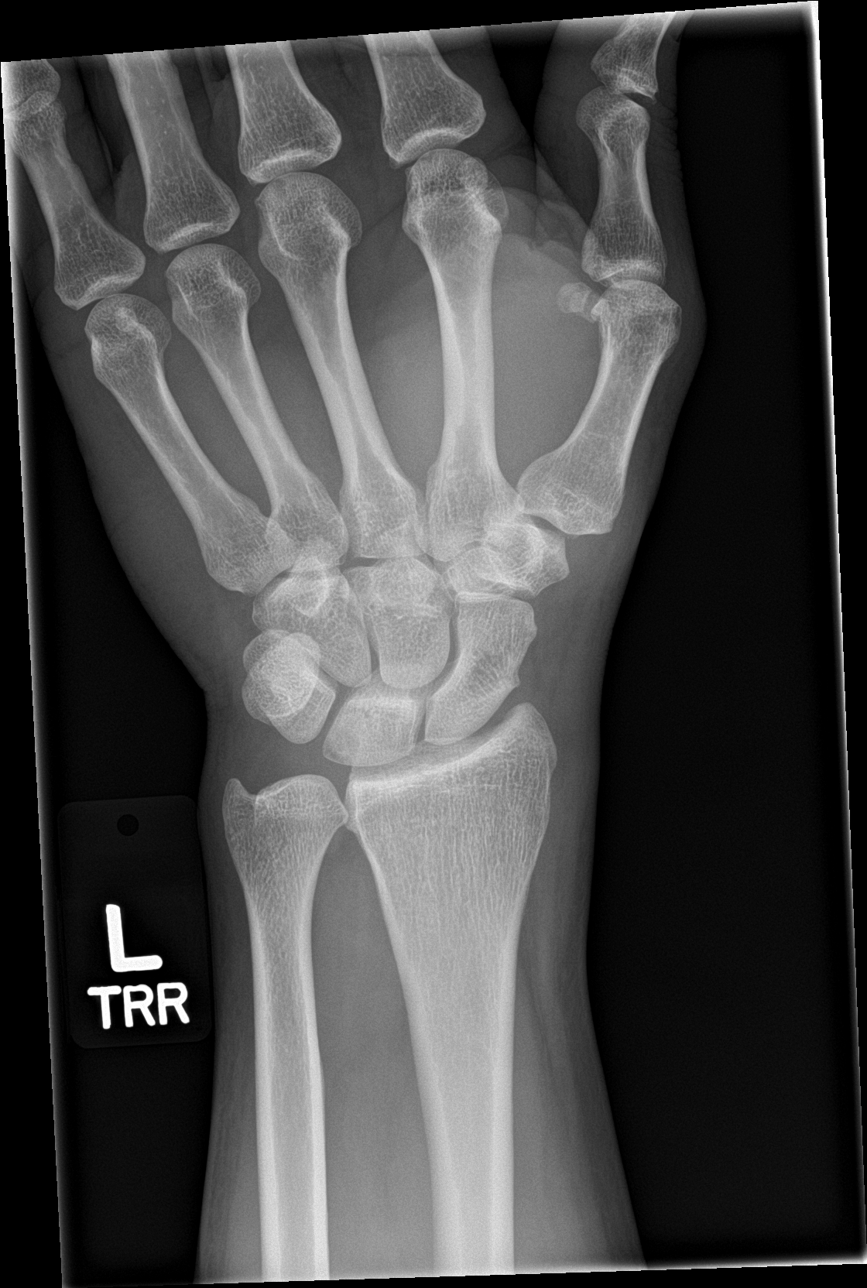

[wrist obl]
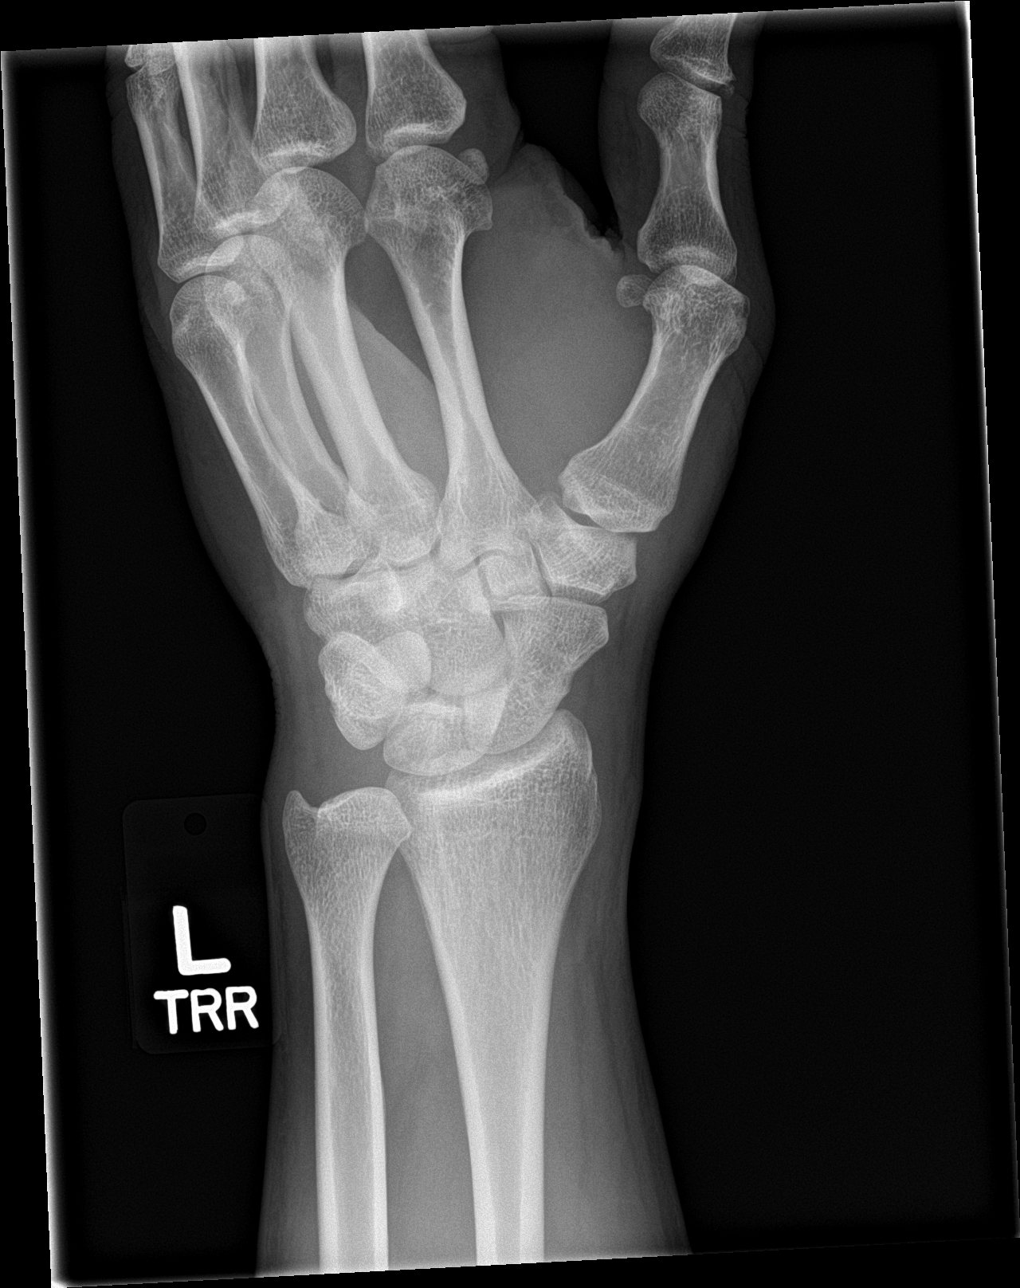

[wrist lat]
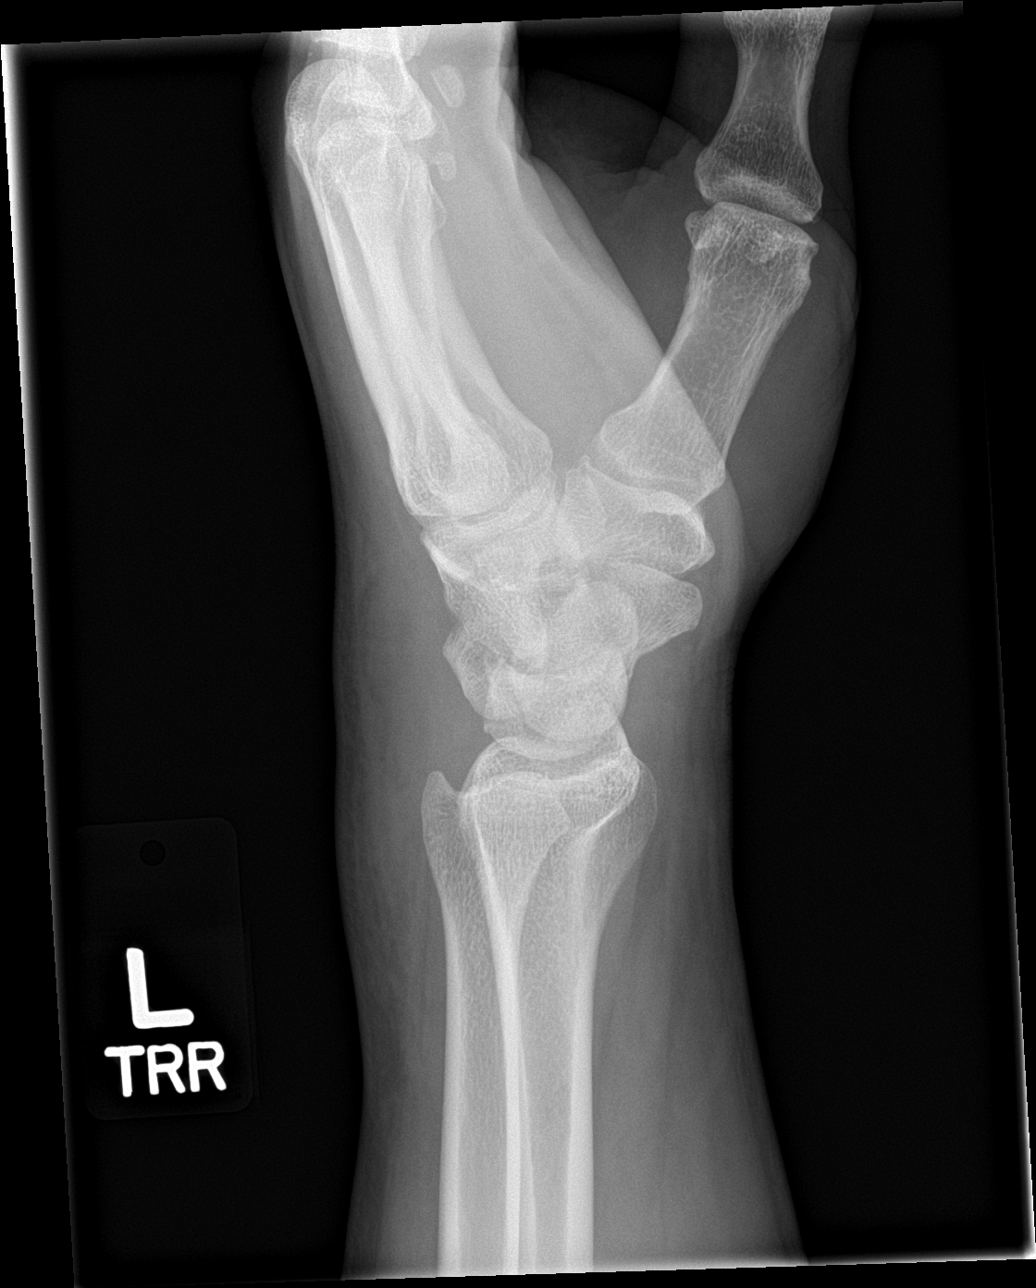

[navicular]
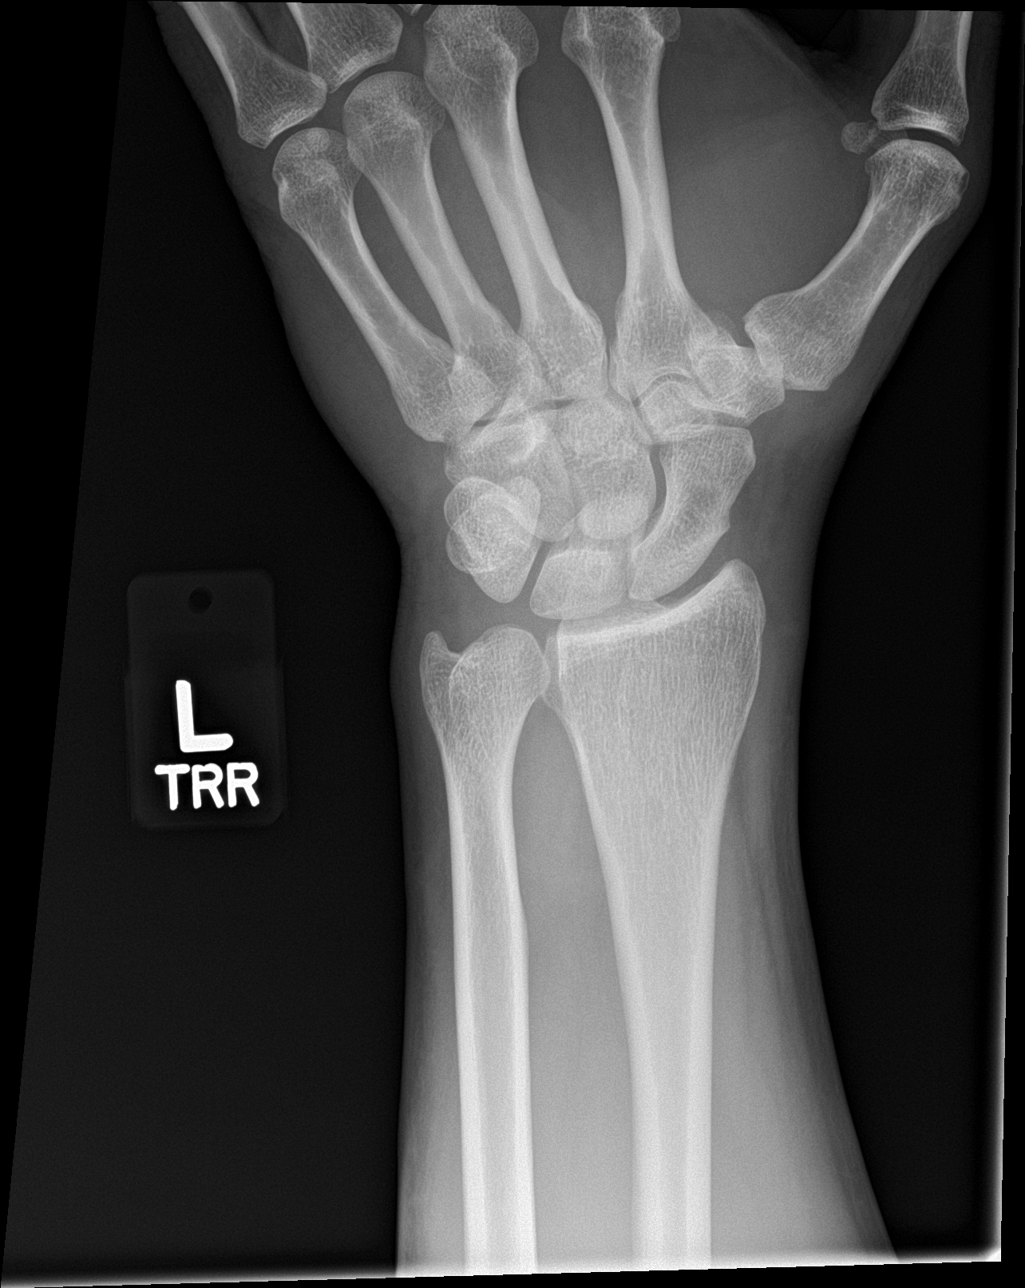

[4 of 4 positions shown; findings below may reference images not displayed]

FINDINGS: There is no evidence of fracture or dislocation. There is no
evidence of arthropathy or other focal bone abnormality. Soft
tissues are unremarkable.
IMPRESSION: Negative.

By: Ivan Morataya M.D.

## 2017-02-14 ENCOUNTER — Ambulatory Visit: Payer: Managed Care, Other (non HMO) | Admitting: Nurse Practitioner

## 2017-03-14 ENCOUNTER — Ambulatory Visit (INDEPENDENT_AMBULATORY_CARE_PROVIDER_SITE_OTHER): Payer: Managed Care, Other (non HMO) | Admitting: Nurse Practitioner

## 2017-03-14 ENCOUNTER — Encounter: Payer: Self-pay | Admitting: Nurse Practitioner

## 2017-03-14 VITALS — BP 121/68 | HR 79 | Temp 98.2°F | Ht 72.0 in | Wt 191.2 lb

## 2017-03-14 DIAGNOSIS — Z720 Tobacco use: Secondary | ICD-10-CM | POA: Diagnosis not present

## 2017-03-14 DIAGNOSIS — R030 Elevated blood-pressure reading, without diagnosis of hypertension: Secondary | ICD-10-CM | POA: Diagnosis not present

## 2017-03-14 NOTE — Patient Instructions (Signed)
Bill Hernandez, Thank you for coming in to clinic today.  1. You are doing a great job with diet, exercise, and reducing your smoking!   Your blood pressure is very well controlled.  Please schedule a follow-up appointment with Wilhelmina McardleLauren Izora Benn, AGNP. Return in about 5 months (around 08/14/2017) for annual physical.  If you have any other questions or concerns, please feel free to call the clinic or send a message through MyChart. You may also schedule an earlier appointment if necessary.  You will receive a survey after today's visit either digitally by e-mail or paper by [employer] mail. Your experiences and feedback matter to us.  Please respond so we know how we are doing as we provide care for you.   Wilhelmina McardleLauren Teshawn Moan, DNP, AGNP-BC Adult Gerontology Nurse Practitioner [facility], [facility]

## 2017-03-14 NOTE — Progress Notes (Signed)
Subjective:    Patient ID: Bill Hernandez, male    DOB: [birth year], 46 y.o.   MRN: [id number]  Bill Hernandez is a 46 y.o. male presenting on 03/14/2017 for Hypertension   HPI  Elevated BP - He is checking BP at home or outside of clinic.  Readings 110-120/70-80 - Current medications: none - only managed w/ lifestyle changes ("Eating right." Reducing salt.  Reducing stress.) - Pt denies headache, lightheadedness, dizziness, changes in vision, chest tightness/pressure, palpitations, leg swelling, sudden loss of speech or loss of consciousness. - He  reports no regular exercise routine outside a very physically active job. - His diet is low in salt, low in fat, and moderate in carbohydrates.  Has cut back smoking and now only smokes 2-3 cigarettes per day - sometimes only 1/2 cigarette.   Influenza prevention: Will obtain Flu shot at work.   Social History  Substance Use Topics  . Smoking status: Current Every Day Smoker    Packs/day: 0.25    Types: Cigarettes  . Smokeless tobacco: Current User     Comment: Down to 2-3 cigarettes per day from 8-9 cigarettes per day  . Alcohol use 1.2 - 1.8 oz/week    2 - 3 Cans of beer per week    Review of Systems Per HPI unless specifically indicated above     Objective:    BP 121/68 (BP Location: Right Arm, Patient Position: Sitting, Cuff Size: Normal)   Pulse 79   Temp 98.2 F (36.8 C) (Oral)   Ht 6' (1.829 m)   Wt 191 lb 3.2 oz (86.7 kg)   BMI 25.93 kg/m   Wt Readings from Last 3 Encounters:  03/14/17 191 lb 3.2 oz (86.7 kg)  10/30/16 192 lb 6.4 oz (87.3 kg)  09/28/16 194 lb (88 kg)    Physical Exam  General - healthy, well-appearing, NAD HEENT - Normocephalic, atraumatic Heart - RRR, no murmurs heard Lungs - Clear throughout all lobes, no wheezing, crackles, or rhonchi. Normal work of breathing. Extremeties - non-tender, no edema, cap refill < 2 seconds, peripheral pulses intact +2 bilaterally Skin - warm,  dry Neuro - awake, alert, oriented x3, normal gait Psych - Normal mood and affect, normal behavior   Results for orders placed or performed in visit on 09/28/16  COMPLETE METABOLIC PANEL WITH GFR  Result Value Ref Range   Sodium 140 135 - 146 mmol/L   Potassium 3.9 3.5 - 5.3 mmol/L   Chloride 105 98 - 110 mmol/L   CO2 26 20 - 31 mmol/L   Glucose, Bld 85 65 - 99 mg/dL   BUN 14 7 - 25 mg/dL   Creat 1.02 0.60 - 1.35 mg/dL   Total Bilirubin 0.6 0.2 - 1.2 mg/dL   Alkaline Phosphatase 67 40 - 115 U/L   AST 24 10 - 40 U/L   ALT 21 9 - 46 U/L   Total Protein 7.2 6.1 - 8.1 g/dL   Albumin 4.8 3.6 - 5.1 g/dL   Calcium 9.3 8.6 - 10.3 mg/dL   GFR, Est African American >89 >=60 mL/min   GFR, Est Non African American 87 >=60 mL/min  Lipid Profile  Result Value Ref Range   Cholesterol 196 <200 mg/dL   Triglycerides 152 (H) <150 mg/dL   HDL 44 >40 mg/dL   Total CHOL/HDL Ratio 4.5 <5.0 Ratio   VLDL 30 <30 mg/dL   LDL Cholesterol 122 (H) <100 mg/dL  TSH  Result Value Ref Range  TSH 1.60 0.40 - 4.50 mIU/L  CBC with Differential  Result Value Ref Range   WBC 8.7 3.8 - 10.8 K/uL   RBC 5.32 4.20 - 5.80 MIL/uL   Hemoglobin 14.4 13.2 - 17.1 g/dL   HCT 43.5 38.5 - 50.0 %   MCV 81.8 80.0 - 100.0 fL   MCH 27.1 27.0 - 33.0 pg   MCHC 33.1 32.0 - 36.0 g/dL   RDW 13.1 11.0 - 15.0 %   Platelets 194 140 - 400 K/uL   MPV 11.5 7.5 - 12.5 fL   Neutro Abs 5,046 1,500 - 7,800 cells/uL   Lymphs Abs 2,784 850 - 3,900 cells/uL   Monocytes Absolute 609 200 - 950 cells/uL   Eosinophils Absolute 261 15 - 500 cells/uL   Basophils Absolute 0 0 - 200 cells/uL   Neutrophils Relative % 58 %   Lymphocytes Relative 32 %   Monocytes Relative 7 %   Eosinophils Relative 3 %   Basophils Relative 0 %   Smear Review Criteria for review not met       Assessment & Plan:   Problem List Items Addressed This Visit      Other   Tobacco abuse - Primary    Pt not ready to completely quit.  Has cut back and is  working to continue cutting down.  Plan: 1. Encouraged smoking cessation for CV health. 2. Follow up 1 year followup.  Discussion today >5 minutes (<10 minutes) specifically on counseling on risks of tobacco use, complications regarding lung cancer and cardiovascular disease, treatment, and importance of completing smoking cessation.       Elevated BP without diagnosis of hypertension    Prior elevated BP in clinic.  Pt has been making lifestyle changes and now BP is well controlled.  Plan: 1. Continue DASH diet. 2. Continue active lifestyle. 3. Follow up w/ 1 year annual physical.         No orders of the defined types were placed in this encounter.     Follow up plan: Return in about 5 months (around 08/14/2017) for annual physical.    Cassell Smiles, DNP, AGPCNP-BC Adult Gerontology Primary Care Nurse Practitioner [facility] 03/16/2017, 12:55 PM

## 2017-03-16 DIAGNOSIS — R030 Elevated blood-pressure reading, without diagnosis of hypertension: Secondary | ICD-10-CM

## 2017-03-16 HISTORY — DX: Elevated blood-pressure reading, without diagnosis of hypertension: R03.0

## 2017-03-16 NOTE — Assessment & Plan Note (Signed)
Pt not ready to completely quit.  Has cut back and is working to continue cutting down.  Plan: 1. Encouraged smoking cessation for CV health. 2. Follow up 1 year followup.  Discussion today >5 minutes (<10 minutes) specifically on counseling on risks of tobacco use, complications regarding lung cancer and cardiovascular disease, treatment, and importance of completing smoking cessation.

## 2017-03-16 NOTE — Assessment & Plan Note (Signed)
Prior elevated BP in clinic.  Pt has been making lifestyle changes and now BP is well controlled.  Plan: 1. Continue DASH diet. 2. Continue active lifestyle. 3. Follow up w/ 1 year annual physical.

## 2017-04-02 ENCOUNTER — Ambulatory Visit: Payer: Managed Care, Other (non HMO) | Admitting: Nurse Practitioner

## 2017-09-12 ENCOUNTER — Encounter: Payer: Managed Care, Other (non HMO) | Admitting: Nurse Practitioner

## 2017-10-04 ENCOUNTER — Encounter: Payer: Managed Care, Other (non HMO) | Admitting: Nurse Practitioner

## 2017-10-10 ENCOUNTER — Other Ambulatory Visit: Payer: Self-pay

## 2017-10-10 ENCOUNTER — Ambulatory Visit (INDEPENDENT_AMBULATORY_CARE_PROVIDER_SITE_OTHER): Payer: Managed Care, Other (non HMO) | Admitting: Nurse Practitioner

## 2017-10-10 ENCOUNTER — Encounter: Payer: Self-pay | Admitting: Nurse Practitioner

## 2017-10-10 VITALS — BP 138/88 | HR 86 | Temp 98.6°F | Ht 72.0 in | Wt 196.0 lb

## 2017-10-10 DIAGNOSIS — Z7689 Persons encountering health services in other specified circumstances: Secondary | ICD-10-CM | POA: Diagnosis not present

## 2017-10-10 DIAGNOSIS — Z0000 Encounter for general adult medical examination without abnormal findings: Secondary | ICD-10-CM | POA: Diagnosis not present

## 2017-10-10 DIAGNOSIS — Z202 Contact with and (suspected) exposure to infections with a predominantly sexual mode of transmission: Secondary | ICD-10-CM | POA: Diagnosis not present

## 2017-10-10 DIAGNOSIS — Z23 Encounter for immunization: Secondary | ICD-10-CM | POA: Diagnosis not present

## 2017-10-10 NOTE — Patient Instructions (Addendum)
Bill Hernandez,   Thank you for coming in to clinic today.  1. Normal physical exam today.  2. You will be due for FASTING BLOOD WORK.  This means you should eat no food or drink after midnight.  Drink only water or coffee without cream/sugar on the morning of your lab visit. - Please go ahead and schedule a "Lab Only" visit in the morning at the clinic for lab draw in the next 7 days. - Your results will be available about 2-3 days after blood draw.  If you have set up a MyChart account, you can can log in to MyChart online to view your results and a brief explanation. Also, we can discuss your results together at your next office visit if you would like.    Please schedule a follow-up appointment with Bill Hernandez, AGNP. Return in about 6 months (around 04/11/2018) for hypertension AND in 1 year for annual physical.  If you have any other questions or concerns, please feel free to call the clinic or send a message through MyChart. You may also schedule an earlier appointment if necessary.  You will receive a survey after today's visit either digitally by e-mail or paper by [employer] mail. Your experiences and feedback matter to us.  Please respond so we know how we are doing as we provide care for you.   Bill McardleLauren Tericka Devincenzi, DNP, AGNP-BC Adult Gerontology Nurse Practitioner [facility], [facility]

## 2017-10-10 NOTE — Progress Notes (Signed)
Subjective:    Patient ID: Bill Hernandez, male    DOB: 1971/01/15, 47 y.o.   MRN: [id number]  Bill DREIBELBIS is a 47 y.o. male presenting on 10/10/2017 for Employment Physical   HPI Annual Physical Exam Patient has been feeling well.  They have no acute concerns today. Sleeps 6-7 hours per night uninterrupted.  HEALTH MAINTENANCE: Weight/BMI: steadily increasing Physical activity: lots at work Diet: fast food  1 x per week, cooking baked foods and vegetables at home Seatbelt: always Sunscreen: never Prostate exam/PSA: due today Colon Cancer Screen: declines until age 7 HIV/HEP C: due today - high risk sexual behavior with more than one sexual partner in last 6 months.  Pt declines GC/Chlamydia testing. Optometry: none recently Dentistry: no recent  VACCINES: Tetanus: due today  Past Medical History:  Diagnosis Date  . Elevated BP without diagnosis of hypertension 03/16/2017  . Elevated low density lipoprotein (LDL) cholesterol level 10/30/2016  . Tobacco abuse 09/28/2016   Past Surgical History:  Procedure Laterality Date  . NO PAST SURGERIES     Social History   Socioeconomic History  . Marital status: Single    Spouse name: Not on file  . Number of children: Not on file  . Years of education: Not on file  . Highest education level: Not on file  Occupational History  . Not on file  Social Needs  . Financial resource strain: Not on file  . Food insecurity:    Worry: Not on file    Inability: Not on file  . Transportation needs:    Medical: Not on file    Non-medical: Not on file  Tobacco Use  . Smoking status: Current Every Day Smoker    Packs/day: 0.25    Types: Cigarettes  . Smokeless tobacco: Current User  . Tobacco comment: Down to 3-4 cigarettes per day   Substance and Sexual Activity  . Alcohol use: Yes    Alcohol/week: 1.2 - 1.8 oz    Types: 2 - 3 Cans of beer per week  . Drug use: Not Currently  . Sexual activity: Yes    Birth  control/protection: Condom    Comment: not consistently using  Lifestyle  . Physical activity:    Days per week: Not on file    Minutes per session: Not on file  . Stress: Not on file  Relationships  . Social connections:    Talks on phone: Not on file    Gets together: Not on file    Attends religious service: Not on file    Active member of club or organization: Not on file    Attends meetings of clubs or organizations: Not on file    Relationship status: Not on file  . Intimate partner violence:    Fear of current or ex partner: Not on file    Emotionally abused: Not on file    Physically abused: Not on file    Forced sexual activity: Not on file  Other Topics Concern  . Not on file  Social History Narrative  . Not on file   Family History  Problem Relation Age of Onset  . Hypertension Mother   . Hyperlipidemia Mother    No current outpatient medications on file prior to visit.   No current facility-administered medications on file prior to visit.     Review of Systems  Constitutional: Negative for chills and fever.  HENT: Negative for congestion and sore throat.   Eyes: Negative for  pain.  Respiratory: Negative for cough, shortness of breath and wheezing.   Cardiovascular: Negative for chest pain, palpitations and leg swelling.  Gastrointestinal: Negative for abdominal pain, blood in stool, constipation, diarrhea, nausea and vomiting.  Endocrine: Negative for polydipsia.  Genitourinary: Negative for discharge, dysuria, frequency, hematuria and urgency.  Musculoskeletal: Negative for back pain, myalgias and neck pain.  Skin: Negative.  Negative for rash.  Allergic/Immunologic: Negative for environmental allergies.  Neurological: Negative for dizziness, weakness and headaches.  Hematological: Does not bruise/bleed easily.  Psychiatric/Behavioral: Negative for suicidal ideas. The patient is not nervous/anxious.    Per HPI unless specifically indicated above       Objective:    BP 138/88 (BP Location: Right Arm, Patient Position: Sitting, Cuff Size: Normal)   Pulse 86   Temp 98.6 F (37 C) (Oral)   Ht 6' (1.829 m)   Wt 196 lb (88.9 kg)   BMI 26.58 kg/m   Wt Readings from Last 3 Encounters:  10/10/17 196 lb (88.9 kg)  03/14/17 191 lb 3.2 oz (86.7 kg)  10/30/16 192 lb 6.4 oz (87.3 kg)    Physical Exam  Constitutional: He is oriented to person, place, and time. He appears well-developed and well-nourished. He is active and cooperative.  Non-toxic appearance. He does not have a sickly appearance. He does not appear ill. No distress.  BP 138/88 (BP Location: Right Arm, Patient Position: Sitting, Cuff Size: Normal)   Pulse 86   Temp 98.6 F (37 C) (Oral)   Ht 6' (1.829 m)   Wt 196 lb (88.9 kg)   BMI 26.58 kg/m    HENT:  Head: Normocephalic and atraumatic.  Right Ear: Hearing, tympanic membrane, external ear and ear canal normal.  Left Ear: Hearing, tympanic membrane, external ear and ear canal normal.  Nose: Nose normal.  Mouth/Throat: Uvula is midline, oropharynx is clear and moist and mucous membranes are normal. He does not have dentures. No oral lesions. No trismus in the jaw. Normal dentition. No dental abscesses, uvula swelling, lacerations or dental caries.  Eyes: Pupils are equal, round, and reactive to light. Conjunctivae, EOM and lids are normal. Right eye exhibits no discharge. Left eye exhibits no discharge. No scleral icterus.  Neck: Normal range of motion, full passive range of motion without pain and phonation normal. Neck supple. No neck rigidity. No tracheal deviation, no edema, no erythema and normal range of motion present. No thyromegaly present.  Cardiovascular: Normal rate, regular rhythm, S1 normal, S2 normal, normal heart sounds, intact distal pulses and normal pulses. Exam reveals no gallop and no friction rub.  No murmur heard. Pulmonary/Chest: Effort normal and breath sounds normal. No respiratory distress. He has no  wheezes. He has no rales.  Abdominal: Soft. Normal appearance and bowel sounds are normal. He exhibits no distension and no mass. There is no hepatosplenomegaly. There is no tenderness. There is no rebound and no guarding. No hernia.  Musculoskeletal: Normal range of motion. He exhibits no tenderness.  Lymphadenopathy:    He has no cervical adenopathy.  Neurological: He is alert and oriented to person, place, and time. He has normal strength and normal reflexes. He displays no tremor. No cranial nerve deficit. He exhibits normal muscle tone. Coordination and gait normal.  Skin: Skin is warm, dry and intact. No abrasion, no ecchymosis, no laceration, no lesion and no rash noted. He is not diaphoretic. No cyanosis or erythema. No pallor. Nails show no clubbing.  Psychiatric: He has a normal mood and affect.  His speech is normal and behavior is normal. Judgment and thought content normal. Cognition and memory are normal.     Results for orders placed or performed in visit on 10/10/17  PSA  Result Value Ref Range   PSA 0.5 < OR = 4.0 ng/mL  CBC with Differential  Result Value Ref Range   WBC 6.6 3.8 - 10.8 Thousand/uL   RBC 5.08 4.20 - 5.80 Million/uL   Hemoglobin 13.8 13.2 - 17.1 g/dL   HCT 09.8 11.9 - 14.7 %   MCV 79.9 (L) 80.0 - 100.0 fL   MCH 27.2 27.0 - 33.0 pg   MCHC 34.0 32.0 - 36.0 g/dL   RDW 82.9 56.2 - 13.0 %   Platelets 189 140 - 400 Thousand/uL   MPV 11.6 7.5 - 12.5 fL   Neutro Abs 3,663 1,500 - 7,800 cells/uL   Lymphs Abs 2,350 850 - 3,900 cells/uL   WBC mixed population 436 200 - 950 cells/uL   Eosinophils Absolute 132 15 - 500 cells/uL   Basophils Absolute 20 0 - 200 cells/uL   Neutrophils Relative % 55.5 %   Total Lymphocyte 35.6 %   Monocytes Relative 6.6 %   Eosinophils Relative 2.0 %   Basophils Relative 0.3 %  COMPLETE METABOLIC PANEL WITH GFR  Result Value Ref Range   Glucose, Bld 102 (H) 65 - 99 mg/dL   BUN 15 7 - 25 mg/dL   Creat 8.65 7.84 - 6.96 mg/dL    GFR, Est Non African American 75 > OR = 60 mL/min/1.3m2   GFR, Est African American 87 > OR = 60 mL/min/1.87m2   BUN/Creatinine Ratio NOT APPLICABLE 6 - 22 (calc)   Sodium 139 135 - 146 mmol/L   Potassium 4.1 3.5 - 5.3 mmol/L   Chloride 104 98 - 110 mmol/L   CO2 30 20 - 32 mmol/L   Calcium 9.0 8.6 - 10.3 mg/dL   Total Protein 6.5 6.1 - 8.1 g/dL   Albumin 4.4 3.6 - 5.1 g/dL   Globulin 2.1 1.9 - 3.7 g/dL (calc)   AG Ratio 2.1 1.0 - 2.5 (calc)   Total Bilirubin 0.4 0.2 - 1.2 mg/dL   Alkaline phosphatase (APISO) 79 40 - 115 U/L   AST 26 10 - 40 U/L   ALT 25 9 - 46 U/L  Hemoglobin A1c  Result Value Ref Range   Hgb A1c MFr Bld 6.1 (H) <5.7 % of total Hgb   Mean Plasma Glucose 128 (calc)   eAG (mmol/L) 7.1 (calc)  TSH  Result Value Ref Range   TSH 2.42 0.40 - 4.50 mIU/L  Lipid panel  Result Value Ref Range   Cholesterol 199 <200 mg/dL   HDL 42 >29 mg/dL   Triglycerides 528 (H) <150 mg/dL   LDL Cholesterol (Calc) 117 (H) mg/dL (calc)   Total CHOL/HDL Ratio 4.7 <5.0 (calc)   Non-HDL Cholesterol (Calc) 157 (H) <130 mg/dL (calc)  HIV antibody  Result Value Ref Range   HIV 1&2 Ab, 4th Generation NON-REACTIVE NON-REACTI  RPR  Result Value Ref Range   RPR Ser Ql NON-REACTIVE NON-REACTI      Assessment & Plan:   Problem List Items Addressed This Visit    None    Visit Diagnoses    Encounter for annual physical exam    -  Primary   Relevant Orders   PSA (Completed)   CBC with Differential (Completed)   COMPLETE METABOLIC PANEL WITH GFR (Completed)   Hemoglobin A1c (Completed)  TSH (Completed)   Lipid panel (Completed)   HIV antibody (Completed)   RPR (Completed)   Potential exposure to STD       Relevant Orders   HIV antibody (Completed)   RPR (Completed)   Encounter for vaccination       Relevant Orders   Tdap vaccine greater than or equal to 7yo IM (Completed)      Physical exam with no new findings.  Well adult with no acute concerns except to add STD screening  for higher risk sexual behaviors with more than one partner in last 6 months.  Plan: 1. Obtain health maintenance screenings. - Labs as ordered - Defer colon cancer screening to age 49. - Administer TDAP as above since > 10 years since last vaccination.  2. Return 1 year for annual physical.  Follow up plan: Return in about 6 months (around 04/11/2018) for hypertension AND in 1 year for annual physical.  Wilhelmina McardleLauren Auburn Hert, DNP, AGPCNP-BC Adult Gerontology Primary Care Nurse Practitioner [facility] 10/18/2017, 11:18 AM

## 2017-10-18 ENCOUNTER — Encounter: Payer: Self-pay | Admitting: Nurse Practitioner

## 2017-10-18 LAB — CBC WITH DIFFERENTIAL/PLATELET
Basophils Absolute: 20 cells/uL (ref 0–200)
Basophils Relative: 0.3 %
Eosinophils Absolute: 132 cells/uL (ref 15–500)
Eosinophils Relative: 2 %
HCT: 40.6 % (ref 38.5–50.0)
Hemoglobin: 13.8 g/dL (ref 13.2–17.1)
Lymphs Abs: 2350 cells/uL (ref 850–3900)
MCH: 27.2 pg (ref 27.0–33.0)
MCHC: 34 g/dL (ref 32.0–36.0)
MCV: 79.9 fL — ABNORMAL LOW (ref 80.0–100.0)
MPV: 11.6 fL (ref 7.5–12.5)
Monocytes Relative: 6.6 %
Neutro Abs: 3663 cells/uL (ref 1500–7800)
Neutrophils Relative %: 55.5 %
Platelets: 189 10*3/uL (ref 140–400)
RBC: 5.08 10*6/uL (ref 4.20–5.80)
RDW: 12.1 % (ref 11.0–15.0)
Total Lymphocyte: 35.6 %
WBC mixed population: 436 cells/uL (ref 200–950)
WBC: 6.6 10*3/uL (ref 3.8–10.8)

## 2017-10-18 LAB — HEMOGLOBIN A1C
Hgb A1c MFr Bld: 6.1 % of total Hgb — ABNORMAL HIGH (ref <5.7)
Mean Plasma Glucose: 128 (calc)
eAG (mmol/L): 7.1 (calc)

## 2017-10-18 LAB — COMPLETE METABOLIC PANEL WITH GFR
AG Ratio: 2.1 (calc) (ref 1.0–2.5)
ALT: 25 U/L (ref 9–46)
AST: 26 U/L (ref 10–40)
Albumin: 4.4 g/dL (ref 3.6–5.1)
Alkaline phosphatase (APISO): 79 U/L (ref 40–115)
BUN: 15 mg/dL (ref 7–25)
CO2: 30 mmol/L (ref 20–32)
Calcium: 9 mg/dL (ref 8.6–10.3)
Chloride: 104 mmol/L (ref 98–110)
Creat: 1.14 mg/dL (ref 0.60–1.35)
GFR, Est African American: 87 mL/min/{1.73_m2} (ref >=60)
GFR, Est Non African American: 75 mL/min/{1.73_m2} (ref >=60)
Globulin: 2.1 g/dL (calc) (ref 1.9–3.7)
Glucose, Bld: 102 mg/dL — ABNORMAL HIGH (ref 65–99)
Potassium: 4.1 mmol/L (ref 3.5–5.3)
Sodium: 139 mmol/L (ref 135–146)
Total Bilirubin: 0.4 mg/dL (ref 0.2–1.2)
Total Protein: 6.5 g/dL (ref 6.1–8.1)

## 2017-10-18 LAB — LIPID PANEL
Cholesterol: 199 mg/dL (ref <200)
HDL: 42 mg/dL (ref >40)
LDL Cholesterol (Calc): 117 mg/dL (calc) — ABNORMAL HIGH
Non-HDL Cholesterol (Calc): 157 mg/dL (calc) — ABNORMAL HIGH (ref <130)
Total CHOL/HDL Ratio: 4.7 (calc) (ref <5.0)
Triglycerides: 297 mg/dL — ABNORMAL HIGH (ref <150)

## 2017-10-18 LAB — HIV ANTIBODY (ROUTINE TESTING W REFLEX): HIV 1&2 Ab, 4th Generation: NONREACTIVE

## 2017-10-18 LAB — TSH: TSH: 2.42 mIU/L (ref 0.40–4.50)

## 2017-10-18 LAB — RPR: RPR Ser Ql: NONREACTIVE

## 2017-10-18 LAB — PSA: PSA: 0.5 ng/mL (ref <=4.0)

## 2017-12-18 ENCOUNTER — Encounter: Payer: Self-pay | Admitting: Nurse Practitioner

## 2017-12-18 ENCOUNTER — Ambulatory Visit (INDEPENDENT_AMBULATORY_CARE_PROVIDER_SITE_OTHER): Payer: Managed Care, Other (non HMO) | Admitting: Nurse Practitioner

## 2017-12-18 VITALS — BP 114/80 | HR 102 | Temp 101.1°F | Ht 72.0 in | Wt 189.8 lb

## 2017-12-18 DIAGNOSIS — J020 Streptococcal pharyngitis: Secondary | ICD-10-CM | POA: Diagnosis not present

## 2017-12-18 DIAGNOSIS — R070 Pain in throat: Secondary | ICD-10-CM

## 2017-12-18 LAB — POCT RAPID STREP A (OFFICE): Rapid Strep A Screen: POSITIVE — AB

## 2017-12-18 MED ORDER — AMOXICILLIN 500 MG PO TABS: 500.0000 mg | ORAL_TABLET | Freq: Two times a day (BID) | ORAL | 0 refills | Status: AC

## 2017-12-18 NOTE — Progress Notes (Signed)
Subjective:    Patient ID: Bill Hernandez, male    DOB: August 29, 1970, 47 y.o.   MRN: [id number]  Bill Hernandez is a 47 y.o. male presenting on 12/18/2017 for Sore Throat (sore throat, difficulty swallowing, fever and chills x 2 days )   HPI  Sore throat Patient presents today with complaints of sore throat and difficulty swallowing x 2 days.  Patient first noticed swollen glands early Sunday morning.  Progressively worsening, but woke with sweat and drenched clothing and chills.  Has not gotten good rest since that time.  Has had hoarseness, pain with swalllowing over last 2 days.  Denies nasal congestion, drainage, sinus pressure, ear pain. - Pt denies fever, chills, sweats, nausea, vomiting, diarrhea and constipation.   Social History   Tobacco Use  . Smoking status: Current Every Day Smoker    Packs/day: 0.25    Types: Cigarettes  . Smokeless tobacco: Current User  . Tobacco comment: Down to 3-4 cigarettes per day   Substance Use Topics  . Alcohol use: Yes    Alcohol/week: 1.2 - 1.8 oz    Types: 2 - 3 Cans of beer per week  . Drug use: Not Currently    Review of Systems Per HPI unless specifically indicated above     Objective:    BP 114/80 (BP Location: Right Arm, Patient Position: Sitting, Cuff Size: Normal)   Pulse (!) 102   Temp (!) 101.1 F (38.4 C) (Oral)   Ht 6' (1.829 m)   Wt 189 lb 12.8 oz (86.1 kg)   BMI 25.74 kg/m   Wt Readings from Last 3 Encounters:  12/18/17 189 lb 12.8 oz (86.1 kg)  10/10/17 196 lb (88.9 kg)  03/14/17 191 lb 3.2 oz (86.7 kg)    Physical Exam  Constitutional: He is oriented to person, place, and time. He appears well-developed and well-nourished. No distress.  HENT:  Head: Normocephalic and atraumatic.  Right Ear: Hearing, tympanic membrane, external ear and ear canal normal.  Left Ear: Hearing, tympanic membrane, external ear and ear canal normal.  Nose: No mucosal edema or rhinorrhea. Right sinus exhibits no maxillary  sinus tenderness and no frontal sinus tenderness. Left sinus exhibits no maxillary sinus tenderness and no frontal sinus tenderness.  Mouth/Throat: Uvula is midline and mucous membranes are normal. Posterior oropharyngeal edema and posterior oropharyngeal erythema present. No oropharyngeal exudate. Tonsils are 2+ on the right. Tonsils are 2+ on the left. Tonsillar exudate.  Neck: Normal range of motion. Neck supple.  Cardiovascular: Normal rate, regular rhythm, S1 normal, S2 normal, normal heart sounds and intact distal pulses.  Pulmonary/Chest: Effort normal and breath sounds normal. No respiratory distress.  Lymphadenopathy:    He has cervical adenopathy.  Neurological: He is alert and oriented to person, place, and time.  Skin: Skin is warm and dry.  Psychiatric: He has a normal mood and affect. His behavior is normal.  Vitals reviewed.    Results for orders placed or performed in visit on 10/10/17  PSA  Result Value Ref Range   PSA 0.5 < OR = 4.0 ng/mL  CBC with Differential  Result Value Ref Range   WBC 6.6 3.8 - 10.8 Thousand/uL   RBC 5.08 4.20 - 5.80 Million/uL   Hemoglobin 13.8 13.2 - 17.1 g/dL   HCT 95.2 84.1 - 32.4 %   MCV 79.9 (L) 80.0 - 100.0 fL   MCH 27.2 27.0 - 33.0 pg   MCHC 34.0 32.0 - 36.0 g/dL  RDW 12.1 11.0 - 15.0 %   Platelets 189 140 - 400 Thousand/uL   MPV 11.6 7.5 - 12.5 fL   Neutro Abs 3,663 1,500 - 7,800 cells/uL   Lymphs Abs 2,350 850 - 3,900 cells/uL   WBC mixed population 436 200 - 950 cells/uL   Eosinophils Absolute 132 15 - 500 cells/uL   Basophils Absolute 20 0 - 200 cells/uL   Neutrophils Relative % 55.5 %   Total Lymphocyte 35.6 %   Monocytes Relative 6.6 %   Eosinophils Relative 2.0 %   Basophils Relative 0.3 %  COMPLETE METABOLIC PANEL WITH GFR  Result Value Ref Range   Glucose, Bld 102 (H) 65 - 99 mg/dL   BUN 15 7 - 25 mg/dL   Creat 4.09 8.11 - 9.14 mg/dL   GFR, Est Non African American 75 > OR = 60 mL/min/1.4m2   GFR, Est African  American 87 > OR = 60 mL/min/1.7m2   BUN/Creatinine Ratio NOT APPLICABLE 6 - 22 (calc)   Sodium 139 135 - 146 mmol/L   Potassium 4.1 3.5 - 5.3 mmol/L   Chloride 104 98 - 110 mmol/L   CO2 30 20 - 32 mmol/L   Calcium 9.0 8.6 - 10.3 mg/dL   Total Protein 6.5 6.1 - 8.1 g/dL   Albumin 4.4 3.6 - 5.1 g/dL   Globulin 2.1 1.9 - 3.7 g/dL (calc)   AG Ratio 2.1 1.0 - 2.5 (calc)   Total Bilirubin 0.4 0.2 - 1.2 mg/dL   Alkaline phosphatase (APISO) 79 40 - 115 U/L   AST 26 10 - 40 U/L   ALT 25 9 - 46 U/L  Hemoglobin A1c  Result Value Ref Range   Hgb A1c MFr Bld 6.1 (H) <5.7 % of total Hgb   Mean Plasma Glucose 128 (calc)   eAG (mmol/L) 7.1 (calc)  TSH  Result Value Ref Range   TSH 2.42 0.40 - 4.50 mIU/L  Lipid panel  Result Value Ref Range   Cholesterol 199 <200 mg/dL   HDL 42 >78 mg/dL   Triglycerides 295 (H) <150 mg/dL   LDL Cholesterol (Calc) 117 (H) mg/dL (calc)   Total CHOL/HDL Ratio 4.7 <5.0 (calc)   Non-HDL Cholesterol (Calc) 157 (H) <130 mg/dL (calc)  HIV antibody  Result Value Ref Range   HIV 1&2 Ab, 4th Generation NON-REACTIVE NON-REACTI  RPR  Result Value Ref Range   RPR Ser Ql NON-REACTIVE NON-REACTI      Assessment & Plan:   Problem List Items Addressed This Visit    None    Visit Diagnoses    Throat pain in adult    -  Primary   Relevant Orders   POCT rapid strep A (Completed)   Strep throat        RAPID STREP POSITIVE Suspected acute strep throat infection based on history and exam. No known strep or sick contacts. No other localized infection, TMs clear. Able to tolerate PO. Centor Score 3.  Plan: 1. Rapid strep Positive today 2. Start antibiotics with Amoxicillin 500mg  BID x 10 days 3. Symptomatic control with NSAID / Tylenol regularly then PRN 4. Improve hydration, advance diet, warm herbal tea with honey 5. Follow-up within 1 week if worsening     Meds ordered this encounter  Medications  . amoxicillin (AMOXIL) 500 MG tablet    Sig: Take 1 tablet  (500 mg total) by mouth every 12 (twelve) hours for 10 days.    Dispense:  20 tablet    Refill:  0    Order Specific Question:   Supervising Provider    Answer:   Smitty CordsKARAMALEGOS, ALEXANDER J [[id number]]    Follow up plan: Return if symptoms worsen or fail to improve.  Wilhelmina McardleLauren Rhylee Nunn, DNP, AGPCNP-BC Adult Gerontology Primary Care Nurse Practitioner [facility] 12/18/2017, 11:44 AM

## 2017-12-18 NOTE — Patient Instructions (Addendum)
Bill Hernandez,   Thank you for coming in to clinic today.  1. It sounds like you have a Strep Throat infection.  Recommend good hand washing. - START taking amoxicillin 500 mg tablet twice daily (about every 12 hours) for 10 days.  Make sure to take all doses of your antibiotic. - While you are on an antibiotic, take a probiotic.  Antibiotics kill good and bad bacteria.  A probiotic helps to replace your good bacteria. Probiotic pills can be found over the counter.  One brand is Florastor, but you can use any brand you prefer.  You can also get good bacteria from foods.  These foods are yogurt, kefir, kombucha, and fresh, refrigerated and uncooked sauerkraut. - Start taking acetaminophen (Tylenol extra strength) 1 to 2 tablets every 6-8 hours for aches or fever/chills for next few days as needed.  Do not take more than 3,000 mg in 24 hours from all medicines.  You may also take ibuprofen 200-400mg  every 8 hours as needed.   - Drink warm herbal tea with honey for sore throat.  - sore throat lozenges or spray will be helpful for your symptoms.  If symptoms are significantly worse with persistent fevers/chills despite tylenol/ibpurofen, nausea, vomiting unable to tolerate food/fluids or medicine, body aches, or shortness of breath, sinus pain pressure or worsening productive cough, then follow-up for re-evaluation, may seek more immediate care at Urgent Care or the ED if you are more concerned that it is an emergency.  Please schedule a follow-up appointment with Wilhelmina Mcardle, AGNP. Return if symptoms worsen or fail to improve.  If you have any other questions or concerns, please feel free to call the clinic or send a message through MyChart. You may also schedule an earlier appointment if necessary.  You will receive a survey after today's visit either digitally by e-mail or paper by [employer]. Your experiences and feedback matter to [place].  Please respond so we know how we are doing as we provide  care for you.   Wilhelmina Mcardle, DNP, AGNP-BC Adult Gerontology Nurse Practitioner [facility], [facility]   Strep Throat Strep throat is a bacterial infection of the throat. Your health care provider may call the infection tonsillitis or pharyngitis, depending on whether there is swelling in the tonsils or at the back of the throat. Strep throat is most common during the cold months of the year in children who are 45-62 years of age, but it can happen during any season in people of any age. This infection is spread from person to person (contagious) through coughing, sneezing, or close contact. What are the causes? Strep throat is caused by the bacteria called Streptococcus pyogenes. What increases the risk? This condition is more likely to develop in:  People who spend time in crowded places where the infection can spread easily.  People who have close contact with someone who has strep throat.  What are the signs or symptoms? Symptoms of this condition include:  Fever or chills.  Redness, swelling, or pain in the tonsils or throat.  Pain or difficulty when swallowing.  White or yellow spots on the tonsils or throat.  Swollen, tender glands in the neck or under the jaw.  Red rash all over the body (rare).  How is this diagnosed? This condition is diagnosed by performing a rapid strep test or by taking a swab of your throat (throat culture test). Results from a rapid strep test are usually ready in a few  minutes, but throat culture test results are available after one or two days. How is this treated? This condition is treated with antibiotic medicine. Follow these instructions at home: Medicines  Take over-the-counter and prescription medicines only as told by your health care provider.  Take your antibiotic as told by your health care provider. Do not stop taking the antibiotic even if you start to feel better.  Have family members who also have a sore throat  or fever tested for strep throat. They may need antibiotics if they have the strep infection. Eating and drinking  Do not share food, drinking cups, or personal items that could cause the infection to spread to other people.  If swallowing is difficult, try eating soft foods until your sore throat feels better.  Drink enough fluid to keep your urine clear or pale yellow. General instructions  Gargle with a salt-water mixture 3-4 times per day or as needed. To make a salt-water mixture, completely dissolve -1 tsp of salt in 1 cup of warm water.  Make sure that all household members wash their hands well.  Get plenty of rest.  Stay home from school or work until you have been taking antibiotics for 24 hours.  Keep all follow-up visits as told by your health care provider. This is important. Contact a health care provider if:  The glands in your neck continue to get bigger.  You develop a rash, cough, or earache.  You cough up a thick liquid that is green, yellow-brown, or bloody.  You have pain or discomfort that does not get better with medicine.  Your problems seem to be getting worse rather than better.  You have a fever. Get help right away if:  You have new symptoms, such as vomiting, severe headache, stiff or painful neck, chest pain, or shortness of breath.  You have severe throat pain, drooling, or changes in your voice.  You have swelling of the neck, or the skin on the neck becomes red and tender.  You have signs of dehydration, such as fatigue, dry mouth, and decreased urination.  You become increasingly sleepy, or you cannot wake up completely.  Your joints become red or painful. This information is not intended to replace advice given to you by your health care provider. Make sure you discuss any questions you have with your health care provider. Document Released: 06/23/2000 Document Revised: 02/23/2016 Document Reviewed: 10/19/2014 Elsevier Interactive  Patient Education  [employer]2018 [employer]

## 2018-03-07 ENCOUNTER — Encounter: Payer: Self-pay | Admitting: Nurse Practitioner

## 2018-04-10 ENCOUNTER — Ambulatory Visit: Payer: Managed Care, Other (non HMO) | Admitting: Nurse Practitioner

## 2018-04-12 ENCOUNTER — Ambulatory Visit: Payer: Managed Care, Other (non HMO) | Admitting: Nurse Practitioner

## 2018-05-10 ENCOUNTER — Ambulatory Visit: Payer: Managed Care, Other (non HMO) | Admitting: Nurse Practitioner

## 2018-05-16 ENCOUNTER — Ambulatory Visit: Payer: Managed Care, Other (non HMO) | Admitting: Nurse Practitioner

## 2018-05-16 VITALS — BP 120/78 | HR 102 | Temp 98.8°F | Resp 16 | Ht 72.0 in | Wt 197.4 lb

## 2018-05-16 DIAGNOSIS — J020 Streptococcal pharyngitis: Secondary | ICD-10-CM | POA: Diagnosis not present

## 2018-05-16 DIAGNOSIS — J029 Acute pharyngitis, unspecified: Secondary | ICD-10-CM

## 2018-05-16 LAB — POCT RAPID STREP A (OFFICE): Rapid Strep A Screen: POSITIVE — AB

## 2018-05-16 MED ORDER — AMOXICILLIN-POT CLAVULANATE 875-125 MG PO TABS: 1.0000 | ORAL_TABLET | Freq: Two times a day (BID) | ORAL | 0 refills | Status: DC

## 2018-05-16 MED ORDER — AMOXICILLIN-POT CLAVULANATE 875-125 MG PO TABS: 1.0000 | ORAL_TABLET | Freq: Two times a day (BID) | ORAL | 0 refills | Status: AC

## 2018-05-16 NOTE — Patient Instructions (Addendum)
Bill Hernandez,   Thank you for coming in to clinic today.  1. You have strep throat.  - START Augmentin 875-125 mg one tablet every 12 hours for 10 days.  - You can use sore throat lozenges.  - May take Tylenol [phone] mg every 8 hours or Advil (ibuprofen) 400 mg every 6-8 hours if needed for fever, aches.  - Drink plenty of water and fluids.  Please schedule a follow-up appointment with Wilhelmina Mcardle, AGNP. Return 5-7 days if symptoms worsen or fail to improve.  If you have any other questions or concerns, please feel free to call the clinic or send a message through MyChart. You may also schedule an earlier appointment if necessary.  You will receive a survey after today's visit either digitally by e-mail or paper by [employer]. Your experiences and feedback matter to [place].  Please respond so we know how we are doing as we provide care for you.   Wilhelmina Mcardle, DNP, AGNP-BC Adult Gerontology Nurse Practitioner [facility], [facility]

## 2018-05-16 NOTE — Progress Notes (Signed)
Subjective:    Patient ID: Bill Hernandez, male    DOB: [birth year], 47 y.o.   MRN: [id number]  Bill Hernandez is a 47 y.o. male presenting on 05/16/2018 for Fever (for 3 days.  Patient has been taking OTC meds ) and Sore Throat   HPI Sore Throat with Fever Patient had onset of illness 3 days ago.  He has symptoms of sore throat, body aches, difficulty with swallowing/soreness only, fever as evidenced by chills/sweats. Has taken aspirin, cold/flu med. - Had feelings that it was flu.  Had 2 amoxicillin and took 1 yesterday and last night.   - Denies cough, rhinorrhea.  Social History   Tobacco Use  . Smoking status: Current Every Day Smoker    Packs/day: 0.25    Types: Cigarettes  . Smokeless tobacco: Current User  . Tobacco comment: Down to 3-4 cigarettes per day   Substance Use Topics  . Alcohol use: Yes    Alcohol/week: 2.0 - 3.0 standard drinks    Types: 2 - 3 Cans of beer per week  . Drug use: Not Currently    Review of Systems Per HPI unless specifically indicated above     Objective:    BP 120/78 (BP Location: Left Arm, Patient Position: Sitting, Cuff Size: Normal)   Pulse (!) 102   Temp 98.8 F (37.1 C)   Resp 16   Ht 6' (1.829 m)   Wt 197 lb 6.4 oz (89.5 kg)   SpO2 98%   BMI 26.77 kg/m   Wt Readings from Last 3 Encounters:  05/16/18 197 lb 6.4 oz (89.5 kg)  12/18/17 189 lb 12.8 oz (86.1 kg)  10/10/17 196 lb (88.9 kg)    Physical Exam  Constitutional: He appears well-developed and well-nourished. He appears ill.  HENT:  Head: Normocephalic and atraumatic.  Right Ear: Hearing, tympanic membrane, external ear and ear canal normal.  Left Ear: Hearing, tympanic membrane, external ear and ear canal normal.  Nose: Mucosal edema and rhinorrhea present. Right sinus exhibits maxillary sinus tenderness and frontal sinus tenderness. Left sinus exhibits maxillary sinus tenderness and frontal sinus tenderness.  Mouth/Throat: Uvula is midline and mucous  membranes are normal. Oropharyngeal exudate (pus on LEFT tonsil), posterior oropharyngeal edema (cobblestoning) and posterior oropharyngeal erythema present. Tonsils are 2+ on the right. Tonsils are 2+ on the left. Tonsillar exudate.  Eyes: Pupils are equal, round, and reactive to light. Conjunctivae and EOM are normal. Right eye exhibits no discharge. Left eye exhibits no discharge.  Neck: Normal range of motion. Neck supple.  Cardiovascular: Normal rate, regular rhythm, S1 normal, S2 normal, normal heart sounds and intact distal pulses.  Pulmonary/Chest: Effort normal and breath sounds normal. No respiratory distress.  Lymphadenopathy:    He has cervical adenopathy.  Neurological: He is alert.  Skin: Skin is warm and dry. Capillary refill takes less than 2 seconds.  Psychiatric: He has a normal mood and affect. His behavior is normal.  Vitals reviewed.   Results for orders placed or performed in visit on 12/18/17  POCT rapid strep A  Result Value Ref Range   Rapid Strep A Screen Positive (A) Negative      Assessment & Plan:   Problem List Items Addressed This Visit    None    Visit Diagnoses    Sore throat    -  Primary   Relevant Orders   POCT rapid strep A   Strep pharyngitis       Relevant Medications  amoxicillin-clavulanate (AUGMENTIN) 875-125 MG tablet    RAPID STREP POSITIVE - Suspected acute strep throat infection based on history and exam. No known strep or sick contacts. No other localized infection, TMs clear. Able to tolerate PO. Centor Score 3.  Plan: 1. Rapid strep Positive today 2. Start antibiotics with Augmentin 875-125 mg BID x 10 days 3. Symptomatic control with NSAID / Tylenol regularly then PRN 4. Improve hydration, advance diet, warm herbal tea with honey 5. Follow-up within 1 week if worsening   Meds ordered this encounter  Medications  . amoxicillin-clavulanate (AUGMENTIN) 875-125 MG tablet    Sig: Take 1 tablet by mouth 2 (two) times daily for  10 days.    Dispense:  20 tablet    Refill:  0    Order Specific Question:   Supervising Provider    Answer:   Smitty Cords [[id number]]    Follow up plan: Return 5-7 days if symptoms worsen or fail to improve.  Wilhelmina Mcardle, DNP, AGPCNP-BC Adult Gerontology Primary Care Nurse Practitioner [facility] 05/16/2018, 9:28 AM

## 2018-05-22 ENCOUNTER — Encounter: Payer: Self-pay | Admitting: Nurse Practitioner

## 2019-07-21 ENCOUNTER — Other Ambulatory Visit: Payer: Self-pay

## 2019-07-21 ENCOUNTER — Ambulatory Visit (INDEPENDENT_AMBULATORY_CARE_PROVIDER_SITE_OTHER): Payer: Managed Care, Other (non HMO) | Admitting: Family Medicine

## 2019-07-21 ENCOUNTER — Encounter: Payer: Self-pay | Admitting: Family Medicine

## 2019-07-21 DIAGNOSIS — Z720 Tobacco use: Secondary | ICD-10-CM

## 2019-07-21 DIAGNOSIS — R490 Dysphonia: Secondary | ICD-10-CM

## 2019-07-21 NOTE — Progress Notes (Signed)
Virtual Visit via Telephone The purpose of this virtual visit is to provide medical care while limiting exposure to the novel coronavirus (COVID19) for both patient and office staff.  Consent was obtained for phone visit:  Yes.   Answered questions that patient had about telehealth interaction:  Yes.   I discussed the limitations, risks, security and privacy concerns of performing an evaluation and management service by telephone. I also discussed with the patient that there may be a patient responsible charge related to this service. The patient expressed understanding and agreed to proceed.  Patient Location: Home Provider Location: [facility])  ---------------------------------------------------------------------- Chief Complaint  Patient presents with  . Hoarse    hoarse x 3 mths. intermittent difficulty swallowing w/ swollen uvula. The difficulty swallowing seems to make it easier for him to ger strangled .    S: Reviewed CMA documentation. I have called patient and gathered additional HPI as follows:  PCP is Cassell Smiles, AGPCNP-BC   HOARSE VOICE Prior history of strep throat in 05/2018, that has resolved He now reports chronic hoarse voice >1.5 years approximately. He says it seems to be getting worse overall and not improving. He has occasional difficulty swallowing at times. Occasional choking or cough. Sometimes it is sore, but not always painful. He has no other clear cause of the hoarse voice. He does say he is outdoors a lot and thought maybe cold air was worsening it. No other medication tried - He is awaiting insurance activation in March 2021 - He already called [facility] ENT to be seen, and was quoted few hundred dollar cost and needed laryngoscopy, he could not schedule this yet due to cost, will wait til after Friday  Denies any high risk travel to areas of current concern for COVID19. Denies any known or suspected exposure to person with or  possibly with COVID19.  Admits hoarse voice Denies any fevers, chills, sweats, body ache, cough, shortness of breath, sinus pain or pressure, headache, abdominal pain, diarrhea  Past Medical History:  Diagnosis Date  . Elevated BP without diagnosis of hypertension 03/16/2017  . Elevated low density lipoprotein (LDL) cholesterol level 10/30/2016  . Tobacco abuse 09/28/2016   Social History   Tobacco Use  . Smoking status: Current Every Day Smoker    Packs/day: 0.25    Types: Cigarettes  . Smokeless tobacco: Never Used  . Tobacco comment: Down to 3-4 cigarettes per day   Substance Use Topics  . Alcohol use: Yes    Alcohol/week: 2.0 - 3.0 standard drinks    Types: 2 - 3 Cans of beer per week  . Drug use: Not Currently   No current outpatient medications on file.  Depression screen [facility] 2/9 07/21/2019 10/10/2017 09/28/2016  Decreased Interest 0 0 0  Down, Depressed, Hopeless 0 0 0  PHQ - 2 Score 0 0 0    No flowsheet data found.  -------------------------------------------------------------------------- O: No physical exam performed due to remote telephone encounter.  Lab results reviewed.  No results found for this or any previous visit (from the past 2160 hour(s)).  -------------------------------------------------------------------------- A&P:  Problem List Items Addressed This Visit    Tobacco abuse    Other Visit Diagnoses    Voice hoarseness    -  Primary     Chronic hoarse voice >1 to 1.5 years, no clear etiology Active smoker daily Prior strep throat but no symptoms of that now, that was 2019  Advised him that I strongly recommend returning to [facility]  ENT as next step in process, he will need diagnostic evaluation including direct laryngoscopy most likely. He did not see them due to cost and is waiting financially until after this Friday - He is still waiting on insurance til March 2021, I advised if he needs referral after March we can send one, if he needs one  now we can as well but likely as self pay will not make difference, since he has already called to schedule at ENT last week.   No orders of the defined types were placed in this encounter.   Follow-up: As needed, after ENT or if need referral  Patient verbalizes understanding with the above medical recommendations including the limitation of remote medical advice.  Specific follow-up and call-back criteria were given for patient to follow-up or seek medical care more urgently if needed.   - Time spent in direct consultation with patient on phone: 7 minutes   Saralyn Pilar, DO [facility] 07/21/2019, 9:59 AM

## 2019-07-21 NOTE — Patient Instructions (Addendum)
Go ahead and call and re-schedule w/ ENT specialist  They will likely need to do the scope in throat to see more of what is causing this problem.  If you need referral let us know, otherwise you can call and schedule without it since no insurance active  [facility] [address]  [place], [place] [address] Ph: [phone]  Please schedule a Follow-up Appointment to: Return in about 3 months (around 10/19/2019) for hoarse voice, f/u ENT - w/ new provider.  If you have any other questions or concerns, please feel free to call the office or send a message through MyChart. You may also schedule an earlier appointment if necessary.  Additionally, you may be receiving a survey about your experience at our office within a few days to 1 week by e-mail or mail. We value your feedback.  Saralyn Pilar, DO [facility], [place]

## 2019-10-20 ENCOUNTER — Ambulatory Visit: Payer: Self-pay | Admitting: Family Medicine

## 2020-09-28 ENCOUNTER — Encounter: Payer: Self-pay | Admitting: Unknown Physician Specialty

## 2020-10-05 ENCOUNTER — Other Ambulatory Visit: Payer: Self-pay

## 2020-10-05 ENCOUNTER — Encounter: Payer: Self-pay | Admitting: Unknown Physician Specialty

## 2020-10-05 ENCOUNTER — Ambulatory Visit (INDEPENDENT_AMBULATORY_CARE_PROVIDER_SITE_OTHER): Payer: BC Managed Care – PPO | Admitting: Unknown Physician Specialty

## 2020-10-05 VITALS — BP 147/95 | HR 87 | Temp 97.5°F | Ht 72.0 in | Wt 223.4 lb

## 2020-10-05 DIAGNOSIS — Z0000 Encounter for general adult medical examination without abnormal findings: Secondary | ICD-10-CM

## 2020-10-05 DIAGNOSIS — Z720 Tobacco use: Secondary | ICD-10-CM

## 2020-10-05 DIAGNOSIS — R030 Elevated blood-pressure reading, without diagnosis of hypertension: Secondary | ICD-10-CM

## 2020-10-05 DIAGNOSIS — E663 Overweight: Secondary | ICD-10-CM

## 2020-10-05 DIAGNOSIS — Z1211 Encounter for screening for malignant neoplasm of colon: Secondary | ICD-10-CM | POA: Diagnosis not present

## 2020-10-05 DIAGNOSIS — Z23 Encounter for immunization: Secondary | ICD-10-CM | POA: Diagnosis not present

## 2020-10-05 NOTE — Progress Notes (Addendum)
BP (!) 147/95 (BP Location: Right Arm, Patient Position: Sitting, Cuff Size: Normal)   Pulse 87   Temp (!) 97.5 F (36.4 C) (Temporal)   Ht 6' (1.829 m)   Wt 223 lb 6.4 oz (101.3 kg)   SpO2 100%   BMI 30.30 kg/m    Subjective:    Patient ID: Bill Hernandez, male    DOB: 02-13-1971, 50 y.o.   MRN: [id number]  HPI: Bill Hernandez is a 50 y.o. male  Chief Complaint  Patient presents with  . Annual Exam   Social History   Socioeconomic History  . Marital status: Single    Spouse name: Not on file  . Number of children: Not on file  . Years of education: Not on file  . Highest education level: Not on file  Occupational History  . Not on file  Tobacco Use  . Smoking status: Current Every Day Smoker    Packs/day: 0.25    Types: Cigarettes  . Smokeless tobacco: Never Used  . Tobacco comment: Down to 3-4 cigarettes per day   Vaping Use  . Vaping Use: Never used  Substance and Sexual Activity  . Alcohol use: Yes    Alcohol/week: 2.0 - 3.0 standard drinks    Types: 2 - 3 Cans of beer per week  . Drug use: Not Currently  . Sexual activity: Yes    Birth control/protection: Condom    Comment: not consistently using  Other Topics Concern  . Not on file  Social History Narrative  . Not on file   Social Determinants of Health   Financial Resource Strain: Not on file  Food Insecurity: Not on file  Transportation Needs: Not on file  Physical Activity: Not on file  Stress: Not on file  Social Connections: Not on file  Intimate Partner Violence: Not on file   Family History  Problem Relation Age of Onset  . Hypertension Mother   . Hyperlipidemia Mother     Past Medical History:  Diagnosis Date  . Elevated BP without diagnosis of hypertension 03/16/2017  . Elevated low density lipoprotein (LDL) cholesterol level 10/30/2016  . Tobacco abuse 09/28/2016   Past Surgical History:  Procedure Laterality Date  . NO PAST SURGERIES       Depression screen [facility] 2/9  10/05/2020 07/21/2019 10/10/2017 09/28/2016  Decreased Interest 0 0 0 0  Down, Depressed, Hopeless 0 0 0 0  PHQ - 2 Score 0 0 0 0   Needs a colonoscopy and influenza vaccine.     Relevant past medical, surgical, family and social history reviewed and updated as indicated. Interim medical history since our last visit reviewed. Allergies and medications reviewed and updated.  Review of Systems  All other systems reviewed and are negative.   Per HPI unless specifically indicated above     Objective:    BP (!) 147/95 (BP Location: Right Arm, Patient Position: Sitting, Cuff Size: Normal)   Pulse 87   Temp (!) 97.5 F (36.4 C) (Temporal)   Ht 6' (1.829 m)   Wt 223 lb 6.4 oz (101.3 kg)   SpO2 100%   BMI 30.30 kg/m   Wt Readings from Last 3 Encounters:  10/05/20 223 lb 6.4 oz (101.3 kg)  05/16/18 197 lb 6.4 oz (89.5 kg)  12/18/17 189 lb 12.8 oz (86.1 kg)    Physical Exam Constitutional:      Appearance: He is well-developed.  HENT:     Head: Normocephalic.  Right Ear: Tympanic membrane, ear canal and external ear normal.     Left Ear: Tympanic membrane, ear canal and external ear normal.     Mouth/Throat:     Pharynx: Uvula midline.  Eyes:     Pupils: Pupils are equal, round, and reactive to light.  Cardiovascular:     Rate and Rhythm: Normal rate and regular rhythm.     Heart sounds: Normal heart sounds. No murmur heard. No friction rub. No gallop.   Pulmonary:     Effort: Pulmonary effort is normal. No respiratory distress.     Breath sounds: Normal breath sounds.  Abdominal:     General: Bowel sounds are normal. There is no distension.     Palpations: Abdomen is soft.     Tenderness: There is no abdominal tenderness.  Genitourinary:    Prostate: Normal.     Rectum: Normal.  Musculoskeletal:        General: Normal range of motion.  Skin:    General: Skin is warm and dry.  Neurological:     Mental Status: He is alert and oriented to person, place, and time.      Deep Tendon Reflexes: Reflexes are normal and symmetric.  Psychiatric:        Behavior: Behavior normal.        Thought Content: Thought content normal.        Judgment: Judgment normal.     Results for orders placed or performed in visit on 05/16/18  POCT rapid strep A  Result Value Ref Range   Rapid Strep A Screen Positive (A) Negative      Assessment & Plan:   Problem List Items Addressed This Visit      Unprioritized   Elevated BP without diagnosis of hypertension    BP high today.  Encouraged to check BP at home.  Diet changes.  Recheck in 3 months      Relevant Orders   Lipid panel   Tobacco abuse    Encouraged to quit.  Not interested at this time       Other Visit Diagnoses    Colon cancer screening    -  Primary   Relevant Orders   Ambulatory referral to Gastroenterology   Routine general medical examination at a health care facility       Relevant Orders   Comprehensive metabolic panel   CBC with Differential/Platelet   PSA   TSH   Overweight (BMI 25.0-29.9)       Relevant Orders   Hemoglobin A1c      Plan on coming back for labs and flu shot.   Colonoscopy referral placed  Follow up plan: Return in about 3 months (around 01/05/2021).

## 2020-10-05 NOTE — Assessment & Plan Note (Signed)
Encouraged to quit. Not interested at this time.  

## 2020-10-05 NOTE — Patient Instructions (Signed)

## 2020-10-05 NOTE — Assessment & Plan Note (Signed)
BP high today.  Encouraged to check BP at home.  Diet changes.  Recheck in 3 months

## 2020-10-06 DIAGNOSIS — Z1211 Encounter for screening for malignant neoplasm of colon: Secondary | ICD-10-CM | POA: Diagnosis not present

## 2020-10-06 DIAGNOSIS — Z23 Encounter for immunization: Secondary | ICD-10-CM | POA: Diagnosis not present

## 2020-10-06 NOTE — Addendum Note (Signed)
Addended by: Burnell Blanks on: 10/06/2020 08:11 AM   Modules accepted: Orders

## 2020-10-08 DIAGNOSIS — Z0000 Encounter for general adult medical examination without abnormal findings: Secondary | ICD-10-CM | POA: Diagnosis not present

## 2020-10-08 DIAGNOSIS — R030 Elevated blood-pressure reading, without diagnosis of hypertension: Secondary | ICD-10-CM | POA: Diagnosis not present

## 2020-10-09 LAB — COMPREHENSIVE METABOLIC PANEL
AG Ratio: 1.7 (calc) (ref 1.0–2.5)
ALT: 42 U/L (ref 9–46)
AST: 28 U/L (ref 10–35)
Albumin: 4.9 g/dL (ref 3.6–5.1)
Alkaline phosphatase (APISO): 84 U/L (ref 35–144)
BUN: 12 mg/dL (ref 7–25)
CO2: 24 mmol/L (ref 20–32)
Calcium: 10.1 mg/dL (ref 8.6–10.3)
Chloride: 103 mmol/L (ref 98–110)
Creat: 1.05 mg/dL (ref 0.70–1.33)
Globulin: 2.9 g/dL (calc) (ref 1.9–3.7)
Glucose, Bld: 100 mg/dL — ABNORMAL HIGH (ref 65–99)
Potassium: 4 mmol/L (ref 3.5–5.3)
Sodium: 139 mmol/L (ref 135–146)
Total Bilirubin: 0.5 mg/dL (ref 0.2–1.2)
Total Protein: 7.8 g/dL (ref 6.1–8.1)

## 2020-10-09 LAB — HEMOGLOBIN A1C
Hgb A1c MFr Bld: 6.8 % of total Hgb — ABNORMAL HIGH (ref <5.7)
Mean Plasma Glucose: 148 mg/dL
eAG (mmol/L): 8.2 mmol/L

## 2020-10-09 LAB — CBC WITH DIFFERENTIAL/PLATELET
Absolute Monocytes: 524 cells/uL (ref 200–950)
Basophils Absolute: 23 cells/uL (ref 0–200)
Basophils Relative: 0.3 %
Eosinophils Absolute: 131 cells/uL (ref 15–500)
Eosinophils Relative: 1.7 %
HCT: 46.5 % (ref 38.5–50.0)
Hemoglobin: 15.4 g/dL (ref 13.2–17.1)
Lymphs Abs: 2187 cells/uL (ref 850–3900)
MCH: 26.9 pg — ABNORMAL LOW (ref 27.0–33.0)
MCHC: 33.1 g/dL (ref 32.0–36.0)
MCV: 81.2 fL (ref 80.0–100.0)
MPV: 12.1 fL (ref 7.5–12.5)
Monocytes Relative: 6.8 %
Neutro Abs: 4836 cells/uL (ref 1500–7800)
Neutrophils Relative %: 62.8 %
Platelets: 218 10*3/uL (ref 140–400)
RBC: 5.73 10*6/uL (ref 4.20–5.80)
RDW: 12.4 % (ref 11.0–15.0)
Total Lymphocyte: 28.4 %
WBC: 7.7 10*3/uL (ref 3.8–10.8)

## 2020-10-09 LAB — PSA: PSA: 0.4 ng/mL (ref <=4.0)

## 2020-10-09 LAB — LIPID PANEL
Cholesterol: 277 mg/dL — ABNORMAL HIGH (ref <200)
HDL: 41 mg/dL (ref >=40)
LDL Cholesterol (Calc): 191 mg/dL (calc) — ABNORMAL HIGH
Non-HDL Cholesterol (Calc): 236 mg/dL (calc) — ABNORMAL HIGH (ref <130)
Total CHOL/HDL Ratio: 6.8 (calc) — ABNORMAL HIGH (ref <5.0)
Triglycerides: 245 mg/dL — ABNORMAL HIGH (ref <150)

## 2020-10-09 LAB — TSH: TSH: 1.65 mIU/L (ref 0.40–4.50)

## 2020-10-12 ENCOUNTER — Other Ambulatory Visit: Payer: Self-pay | Admitting: Unknown Physician Specialty

## 2020-10-12 MED ORDER — ATORVASTATIN CALCIUM 20 MG PO TABS: 20.0000 mg | ORAL_TABLET | Freq: Every day | ORAL | 3 refills | Status: DC

## 2020-10-25 ENCOUNTER — Telehealth (INDEPENDENT_AMBULATORY_CARE_PROVIDER_SITE_OTHER): Payer: Self-pay | Admitting: Gastroenterology

## 2020-10-25 ENCOUNTER — Other Ambulatory Visit: Payer: Self-pay

## 2020-10-25 DIAGNOSIS — Z1211 Encounter for screening for malignant neoplasm of colon: Secondary | ICD-10-CM

## 2020-10-25 MED ORDER — PEG 3350-KCL-NA BICARB-NACL 420 G PO SOLR: 4000.0000 mL | Freq: Once | ORAL | 0 refills | Status: AC

## 2020-10-25 NOTE — Progress Notes (Signed)
Gastroenterology Pre-Procedure Review  Request Date: May 13th Requesting Physician: Dr. Maximino Greenland  PATIENT REVIEW QUESTIONS: The patient responded to the following health history questions as indicated:    1. Are you having any GI issues? no 2. Do you have a personal history of Polyps? no 3. Do you have a family history of Colon Cancer or Polyps? no 4. Diabetes Mellitus? no 5. Joint replacements in the past 12 months?no 6. Major health problems in the past 3 months?no 7. Any artificial heart valves, MVP, or defibrillator?no    MEDICATIONS & ALLERGIES:    Patient reports the following regarding taking any anticoagulation/antiplatelet therapy:   Plavix, Coumadin, Eliquis, Xarelto, Lovenox, Pradaxa, Brilinta, or Effient? no Aspirin? no  Patient confirms/reports the following medications:  Current Outpatient Medications  Medication Sig Dispense Refill  . atorvastatin (LIPITOR) 20 MG tablet Take 1 tablet (20 mg total) by mouth daily. 90 tablet 3   No current facility-administered medications for this visit.    Patient confirms/reports the following allergies:  No Known Allergies  No orders of the defined types were placed in this encounter.   AUTHORIZATION INFORMATION Primary Insurance: 1D#: Group #:  Secondary Insurance: 1D#: Group #:  SCHEDULE INFORMATION: Date:  Time: Location:[facility]

## 2020-11-22 ENCOUNTER — Telehealth: Payer: Self-pay

## 2020-11-22 NOTE — Telephone Encounter (Signed)
Patient called, left VM to return the call to the office or call [facility] with the COVID test questions prior to procedure.

## 2020-11-22 NOTE — Telephone Encounter (Signed)
Copied from CRM [id number]. Topic: General - Other >> Nov 19, 2020  8:21 AM Tamela Oddi wrote: Reason for CRM: Patient would like the nurse to call him regarding his colonoscopy test.  He stated someone called him but he is not sure who and he wanted to know if he should have a covid test before the procedure.  Please call patient to give him more details.  CB# [phone]  Called pt left VM to call [facility] where his referral was placed for any information and questions.  Will route this note to [facility] Nurse just in case the patient returns call to clinic. [facility] Nurse may give patient results if pt returns the call.  CRM has also been created for this msg for call center purposes. Please attach any notes regarding this lab to this result message and do not create a new CRM.   KP

## 2020-11-26 ENCOUNTER — Ambulatory Visit
Admission: RE | Admit: 2020-11-26 | Discharge: 2020-11-26 | Disposition: A | Discharge status: Home / Self Care | Payer: BC Managed Care – PPO | Attending: Gastroenterology | Admitting: Gastroenterology

## 2020-11-26 ENCOUNTER — Ambulatory Visit: Payer: BC Managed Care – PPO | Admitting: Anesthesiology

## 2020-11-26 ENCOUNTER — Encounter: Payer: Self-pay | Admitting: Gastroenterology

## 2020-11-26 ENCOUNTER — Encounter
Admission: RE | Disposition: A | Discharge status: Home / Self Care | Payer: Self-pay | Source: Home / Self Care | Attending: Gastroenterology

## 2020-11-26 DIAGNOSIS — K635 Polyp of colon: Secondary | ICD-10-CM | POA: Diagnosis not present

## 2020-11-26 DIAGNOSIS — Z79899 Other long term (current) drug therapy: Secondary | ICD-10-CM | POA: Insufficient documentation

## 2020-11-26 DIAGNOSIS — Z1211 Encounter for screening for malignant neoplasm of colon: Secondary | ICD-10-CM | POA: Diagnosis not present

## 2020-11-26 DIAGNOSIS — F17210 Nicotine dependence, cigarettes, uncomplicated: Secondary | ICD-10-CM | POA: Insufficient documentation

## 2020-11-26 HISTORY — PX: COLONOSCOPY WITH PROPOFOL: SHX5780

## 2020-11-26 SURGERY — COLONOSCOPY WITH PROPOFOL
Anesthesia: General

## 2020-11-26 MED ORDER — SODIUM CHLORIDE 0.9 % IV SOLN
INTRAVENOUS | Status: DC
  Administered 2020-11-26: 1000 mL via INTRAVENOUS

## 2020-11-26 MED ORDER — LIDOCAINE HCL (CARDIAC) PF 100 MG/5ML IV SOSY
PREFILLED_SYRINGE | INTRAVENOUS | Status: DC | PRN
  Administered 2020-11-26: 40 mg via INTRAVENOUS

## 2020-11-26 MED ORDER — PROPOFOL 10 MG/ML IV BOLUS
INTRAVENOUS | Status: DC | PRN
  Administered 2020-11-26: 90 mg via INTRAVENOUS

## 2020-11-26 MED ORDER — PROPOFOL 500 MG/50ML IV EMUL
INTRAVENOUS | Status: DC | PRN
  Administered 2020-11-26: 175 ug/kg/min via INTRAVENOUS

## 2020-11-26 NOTE — H&P (Signed)
  Bill Bouillon, MD [address], [place], [place], [address] [address], [address], [place], [place], [address] Phone: [phone]  Fax: [phone]  Primary Care Physician:  Galen Manila, NP (Inactive)   Pre-Procedure History & Physical: HPI:  Bill Hernandez is a 50 y.o. male is here for a colonoscopy.   Past Medical History:  Diagnosis Date  . Elevated BP without diagnosis of hypertension 03/16/2017  . Elevated low density lipoprotein (LDL) cholesterol level 10/30/2016  . Tobacco abuse 09/28/2016    Past Surgical History:  Procedure Laterality Date  . NO PAST SURGERIES      Prior to Admission medications   Medication Sig Start Date End Date Taking? Authorizing Provider  atorvastatin (LIPITOR) 20 MG tablet Take 1 tablet (20 mg total) by mouth daily. 10/12/20  Yes Gabriel Cirri, NP    Allergies as of 10/26/2020  . (No Known Allergies)    Family History  Problem Relation Age of Onset  . Hypertension Mother   . Hyperlipidemia Mother     Social History   Socioeconomic History  . Marital status: Single    Spouse name: Not on file  . Number of children: Not on file  . Years of education: Not on file  . Highest education level: Not on file  Occupational History  . Not on file  Tobacco Use  . Smoking status: Current Every Day Smoker    Packs/day: 0.25    Types: Cigarettes  . Smokeless tobacco: Never Used  . Tobacco comment: Down to 3-4 cigarettes per day   Vaping Use  . Vaping Use: Never used  Substance and Sexual Activity  . Alcohol use: Yes    Alcohol/week: 2.0 - 3.0 standard drinks    Types: 2 - 3 Cans of beer per week  . Drug use: Not Currently    Types: Marijuana    Comment: 1 week ago  . Sexual activity: Yes    Birth control/protection: Condom    Comment: not consistently using  Other Topics Concern  . Not on file  Social History Narrative  . Not on file   Social Determinants of Health   Financial Resource Strain: Not  on file  Food Insecurity: Not on file  Transportation Needs: Not on file  Physical Activity: Not on file  Stress: Not on file  Social Connections: Not on file  Intimate Partner Violence: Not on file    Review of Systems: See HPI, otherwise negative ROS  Physical Exam: BP (!) 128/98   Pulse 70   Temp (!) 96.6 F (35.9 C) (Temporal)   Resp 18   Ht 6' (1.829 m)   Wt 100 kg   SpO2 99%   BMI 29.90 kg/m  General:   Alert,  pleasant and cooperative in NAD Head:  Normocephalic and atraumatic. Neck:  Supple; no masses or thyromegaly. Lungs:  Clear throughout to auscultation, normal respiratory effort.    Heart:  +S1, +S2, Regular rate and rhythm, No edema. Abdomen:  Soft, nontender and nondistended. Normal bowel sounds, without guarding, and without rebound.   Neurologic:  Alert and  oriented x4;  grossly normal neurologically.  Impression/Plan: Bill Hernandez is here for a colonoscopy to be performed for average risk screening.  Risks, benefits, limitations, and alternatives regarding  colonoscopy have been reviewed with the patient.  Questions have been answered.  All parties agreeable.   Pasty Spillers, MD  11/26/2020, 10:19 AM

## 2020-11-26 NOTE — Op Note (Signed)
[facility] Patient Name: Bill Hernandez Procedure Date: 11/26/2020 10:15 AM MRN: [id number] Account #: [id number] Date of Birth: 10/09/1970 Admit Type: Outpatient Age: 50 Room: [facility] ENDO ROOM 2 Gender: Male Note Status: Finalized Procedure:             Colonoscopy Indications:           Screening for colorectal malignant neoplasm Providers:             Brendolyn Stockley B. Maximino Greenland MD, MD Referring MD:          Galen Manila (Referring MD) Medicines:             Monitored Anesthesia Care Complications:         No immediate complications. Procedure:             Pre-Anesthesia Assessment:                        - ASA Grade Assessment: II - A patient with mild                         systemic disease.                        - Prior to the procedure, a History and Physical was                         performed, and patient medications, allergies and                         sensitivities were reviewed. The patient's tolerance                         of previous anesthesia was reviewed.                        - The risks and benefits of the procedure and the                         sedation options and risks were discussed with the                         patient. All questions were answered and informed                         consent was obtained.                        - Patient identification and proposed procedure were                         verified prior to the procedure by the physician, the                         nurse, the anesthesiologist, the anesthetist and the                         technician. The procedure was verified in the                         procedure room.  After obtaining informed consent, the colonoscope was                         passed under direct vision. Throughout the procedure,                         the patient's blood pressure, pulse, and oxygen                         saturations were  monitored continuously. The                         Colonoscope was introduced through the anus and                         advanced to the the cecum, identified by appendiceal                         orifice and ileocecal valve. The colonoscopy was                         performed with ease. The patient tolerated the                         procedure well. The quality of the bowel preparation                         was fair. Findings:      The perianal and digital rectal examinations were normal.      A 4 mm polyp was found in the sigmoid colon. The polyp was sessile. The       polyp was removed with a jumbo cold forceps. Resection and retrieval       were complete.      The exam was otherwise without abnormality.      The rectum, sigmoid colon, descending colon, transverse colon, ascending       colon and cecum appeared normal.      The retroflexed view of the distal rectum and anal verge was normal and       showed no anal or rectal abnormalities. Impression:            - Preparation of the colon was fair.                        - One 4 mm polyp in the sigmoid colon, removed with a                         jumbo cold forceps. Resected and retrieved.                        - The examination was otherwise normal.                        - The rectum, sigmoid colon, descending colon,                         transverse colon, ascending colon and cecum are normal.                        -  The distal rectum and anal verge are normal on                         retroflexion view. Recommendation:        - Await pathology results.                        - Discharge patient to home (with escort).                        - Resume previous diet.                        - Continue present medications.                        - Repeat colonoscopy in 1 year, with 2 day prep.                        - The findings and recommendations were discussed with                         the patient.                         - The findings and recommendations were discussed with                         the patient's family.                        - Return to primary care physician as previously                         scheduled. Procedure Code(s):     --- Professional ---                        [phone], Colonoscopy, flexible; with biopsy, single or                         multiple Diagnosis Code(s):     --- Professional ---                        Z12.11, Encounter for screening for malignant neoplasm                         of colon                        K63.5, Polyp of colon CPT copyright 2019 American Medical Association. All rights reserved. The codes documented in this report are preliminary and upon coder review may  be revised to meet current compliance requirements.  Melodie Bouillon, MD Michel Bickers B. Maximino Greenland MD, MD 11/26/2020 10:43:54 AM This report has been signed electronically. Number of Addenda: 0 Note Initiated On: 11/26/2020 10:15 AM Scope Withdrawal Time: 0 hours 11 minutes 14 seconds  Total Procedure Duration: 0 hours 16 minutes 10 seconds  Estimated Blood Loss:  Estimated blood loss: none.      [facility]

## 2020-11-26 NOTE — Anesthesia Procedure Notes (Signed)
Date/Time: 11/26/2020 10:18 AM Performed by: Ginger Carne, CRNA Pre-anesthesia Checklist: Patient identified, Emergency Drugs available, Suction available, Patient being monitored and Timeout performed Patient Re-evaluated:Patient Re-evaluated prior to induction Oxygen Delivery Method: Nasal cannula Preoxygenation: Pre-oxygenation with 100% oxygen Induction Type: IV induction

## 2020-11-26 NOTE — Anesthesia Postprocedure Evaluation (Signed)
Anesthesia Post Note  Patient: Bill Hernandez  Procedure(s) Performed: COLONOSCOPY WITH PROPOFOL (N/A )  Patient location during evaluation: Endoscopy Anesthesia Type: General Level of consciousness: awake and alert and oriented Pain management: pain level controlled Vital Signs Assessment: post-procedure vital signs reviewed and stable Respiratory status: spontaneous breathing Cardiovascular status: blood pressure returned to baseline Anesthetic complications: no   No complications documented.   Last Vitals:  Vitals:   11/26/20 1105 11/26/20 1114  BP: 118/89 127/90  Pulse: 61 63  Resp: 11 16  Temp:    SpO2: 100% 99%    Last Pain:  Vitals:   11/26/20 1114  TempSrc:   PainSc: 0-No pain                 Margrit Minner

## 2020-11-26 NOTE — Anesthesia Preprocedure Evaluation (Signed)
Anesthesia Evaluation  Patient identified by MRN, date of birth, ID band Patient awake    Reviewed: Allergy & Precautions, NPO status , Patient's Chart, lab work & pertinent test results  Airway Mallampati: II  TM Distance: >3 FB     Dental  (+) Upper Dentures   Pulmonary Current Smoker,    Pulmonary exam normal        Cardiovascular negative cardio ROS Normal cardiovascular exam     Neuro/Psych negative neurological ROS  negative psych ROS   GI/Hepatic Neg liver ROS,   Endo/Other  negative endocrine ROS  Renal/GU negative Renal ROS  negative genitourinary   Musculoskeletal negative musculoskeletal ROS (+)   Abdominal Normal abdominal exam  (+)   Peds negative pediatric ROS (+)  Hematology negative hematology ROS (+)   Anesthesia Other Findings Past Medical History: 03/16/2017: Elevated BP without diagnosis of hypertension 10/30/2016: Elevated low density lipoprotein (LDL) cholesterol level 09/28/2016: Tobacco abuse  Reproductive/Obstetrics                             Anesthesia Physical Anesthesia Plan  ASA: II  Anesthesia Plan: General   Post-op Pain Management:    Induction: Intravenous  PONV Risk Score and Plan: Propofol infusion  Airway Management Planned: Nasal Cannula  Additional Equipment:   Intra-op Plan:   Post-operative Plan:   Informed Consent: I have reviewed the patients History and Physical, chart, labs and discussed the procedure including the risks, benefits and alternatives for the proposed anesthesia with the patient or authorized representative who has indicated his/her understanding and acceptance.     Dental advisory given  Plan Discussed with: CRNA and Surgeon  Anesthesia Plan Comments:         Anesthesia Quick Evaluation

## 2020-11-26 NOTE — Transfer of Care (Signed)
Immediate Anesthesia Transfer of Care Note  Patient: Bill Hernandez  Procedure(s) Performed: COLONOSCOPY WITH PROPOFOL (N/A )  Patient Location: PACU  Anesthesia Type:General  Level of Consciousness: sedated  Airway & Oxygen Therapy: Patient Spontanous Breathing  Post-op Assessment: Report given to RN and Post -op Vital signs reviewed and stable  Post vital signs: Reviewed and stable  Last Vitals:  Vitals Value Taken Time  BP 124/81 11/26/20 1048  Temp    Pulse 85 11/26/20 1048  Resp 16 11/26/20 1048  SpO2 94 % 11/26/20 1048    Last Pain:  Vitals:   11/26/20 0914  TempSrc: Temporal  PainSc: 0-No pain         Complications: No complications documented.

## 2020-11-29 ENCOUNTER — Encounter: Payer: Self-pay | Admitting: Gastroenterology

## 2020-11-29 LAB — SURGICAL PATHOLOGY

## 2020-11-30 ENCOUNTER — Encounter: Payer: Self-pay | Admitting: Gastroenterology

## 2021-01-17 ENCOUNTER — Encounter: Payer: Self-pay | Admitting: Internal Medicine

## 2021-01-17 ENCOUNTER — Other Ambulatory Visit: Payer: Self-pay

## 2021-01-17 ENCOUNTER — Ambulatory Visit (INDEPENDENT_AMBULATORY_CARE_PROVIDER_SITE_OTHER): Payer: BC Managed Care – PPO | Admitting: Internal Medicine

## 2021-01-17 VITALS — BP 121/78 | HR 85 | Temp 97.7°F | Resp 17 | Ht 72.0 in | Wt 219.8 lb

## 2021-01-17 DIAGNOSIS — E1165 Type 2 diabetes mellitus with hyperglycemia: Secondary | ICD-10-CM

## 2021-01-17 DIAGNOSIS — Z6829 Body mass index (BMI) 29.0-29.9, adult: Secondary | ICD-10-CM

## 2021-01-17 DIAGNOSIS — E663 Overweight: Secondary | ICD-10-CM | POA: Diagnosis not present

## 2021-01-17 DIAGNOSIS — E782 Mixed hyperlipidemia: Secondary | ICD-10-CM

## 2021-01-17 NOTE — Patient Instructions (Signed)

## 2021-01-17 NOTE — Progress Notes (Signed)
Subjective:    Patient ID: Bill Hernandez, male    DOB: 1970/08/08, 50 y.o.   MRN: [id number]  HPI  Pt presents to the clinic today for follow up of chronic conditions.  HLD: His last LDL was 191, triglycerides 245. He is not taking Atorvastatin. He does not consume a low fat diet.  DM2: His last A1C was 6.8%, 09/2020. He is not taking any oral diabetic medication at this time. He does not check his sugars.  He has not had an eye exam in a few years.  Flu 09/2020. Pneumovax never. Covid Pfizer x 3.  Review of Systems     Past Medical History:  Diagnosis Date   Elevated BP without diagnosis of hypertension 03/16/2017   Elevated low density lipoprotein (LDL) cholesterol level 10/30/2016   Tobacco abuse 09/28/2016    Current Outpatient Medications  Medication Sig Dispense Refill   atorvastatin (LIPITOR) 20 MG tablet Take 1 tablet (20 mg total) by mouth daily. 90 tablet 3   No current facility-administered medications for this visit.    No Known Allergies  Family History  Problem Relation Age of Onset   Hypertension Mother    Hyperlipidemia Mother     Social History   Socioeconomic History   Marital status: Single    Spouse name: Not on file   Number of children: Not on file   Years of education: Not on file   Highest education level: Not on file  Occupational History   Not on file  Tobacco Use   Smoking status: Every Day    Packs/day: 0.25    Pack years: 0.00    Types: Cigarettes   Smokeless tobacco: Never   Tobacco comments:    Down to 3-4 cigarettes per day   Vaping Use   Vaping Use: Never used  Substance and Sexual Activity   Alcohol use: Yes    Alcohol/week: 2.0 - 3.0 standard drinks    Types: 2 - 3 Cans of beer per week   Drug use: Not Currently    Types: Marijuana    Comment: 1 week ago   Sexual activity: Yes    Birth control/protection: Condom    Comment: not consistently using  Other Topics Concern   Not on file  Social History Narrative    Not on file   Social Determinants of Health   Financial Resource Strain: Not on file  Food Insecurity: Not on file  Transportation Needs: Not on file  Physical Activity: Not on file  Stress: Not on file  Social Connections: Not on file  Intimate Partner Violence: Not on file     Constitutional: Denies fever, malaise, fatigue, headache or abrupt weight changes.  HEENT: Denies eye pain, eye redness, ear pain, ringing in the ears, wax buildup, runny nose, nasal congestion, bloody nose, or sore throat. Respiratory: Denies difficulty breathing, shortness of breath, cough or sputum production.   Cardiovascular: Denies chest pain, chest tightness, palpitations or swelling in the hands or feet.  Gastrointestinal: Denies abdominal pain, bloating, constipation, diarrhea or blood in the stool.  GU: Denies urgency, frequency, pain with urination, burning sensation, blood in urine, odor or discharge. Musculoskeletal: Denies decrease in range of motion, difficulty with gait, muscle pain or joint pain and swelling.  Skin: Denies redness, rashes, lesions or ulcercations.  Neurological: Denies dizziness, difficulty with memory, difficulty with speech or problems with balance and coordination.  Psych: Denies anxiety, depression, SI/HI.  No other specific complaints in a complete review  of systems (except as listed in HPI above).  Objective:   Physical Exam  BP 121/78 (BP Location: Right Arm, Patient Position: Sitting, Cuff Size: Large)   Pulse 85   Temp 97.7 F (36.5 C) (Temporal)   Resp 17   Ht 6' (1.829 m)   Wt 219 lb 12.8 oz (99.7 kg)   SpO2 100%   BMI 29.81 kg/m   Wt Readings from Last 3 Encounters:  11/26/20 220 lb 7.4 oz (100 kg)  10/05/20 223 lb 6.4 oz (101.3 kg)  05/16/18 197 lb 6.4 oz (89.5 kg)    General: Appears his stated age, overweight, in NAD. Skin: Warm, dry and intact. No ulcerations noted. HEENT: Head: normal shape and size; Eyes: sclera white and EOMs intact;   Cardiovascular: Normal rate and rhythm. S1,S2 noted.  No murmur, rubs or gallops noted. No JVD or BLE edema. No carotid bruits noted. Pulmonary/Chest: Normal effort and positive vesicular breath sounds. No respiratory distress. No wheezes, rales or ronchi noted.  Musculoskeletal: No difficulty with gait.  Neurological: Alert and oriented.    BMET    Component Value Date/Time   NA 139 10/08/2020 0756   K 4.0 10/08/2020 0756   CL 103 10/08/2020 0756   CO2 24 10/08/2020 0756   GLUCOSE 100 (H) 10/08/2020 0756   BUN 12 10/08/2020 0756   CREATININE 1.05 10/08/2020 0756   CALCIUM 10.1 10/08/2020 0756   GFRNONAA 75 10/17/2017 0922   GFRAA 87 10/17/2017 0922    Lipid Panel     Component Value Date/Time   CHOL 277 (H) 10/08/2020 0756   TRIG 245 (H) 10/08/2020 0756   HDL 41 10/08/2020 0756   CHOLHDL 6.8 (H) 10/08/2020 0756   VLDL 30 09/28/2016 0001   LDLCALC 191 (H) 10/08/2020 0756    CBC    Component Value Date/Time   WBC 7.7 10/08/2020 0756   RBC 5.73 10/08/2020 0756   HGB 15.4 10/08/2020 0756   HCT 46.5 10/08/2020 0756   PLT 218 10/08/2020 0756   MCV 81.2 10/08/2020 0756   MCH 26.9 (L) 10/08/2020 0756   MCHC 33.1 10/08/2020 0756   RDW 12.4 10/08/2020 0756   LYMPHSABS 2,187 10/08/2020 0756   MONOABS 609 09/28/2016 0001   EOSABS 131 10/08/2020 0756   BASOSABS 23 10/08/2020 0756    Hgb A1C Lab Results  Component Value Date   HGBA1C 6.8 (H) 10/08/2020           Assessment & Plan:    Nicki Reaper, NP This visit occurred during the SARS-CoV-2 public health emergency.  Safety protocols were in place, including screening questions prior to the visit, additional usage of staff PPE, and extensive cleaning of exam room while observing appropriate contact time as indicated for disinfecting solutions.

## 2021-01-18 ENCOUNTER — Encounter: Payer: Self-pay | Admitting: Internal Medicine

## 2021-01-18 DIAGNOSIS — Z6826 Body mass index (BMI) 26.0-26.9, adult: Secondary | ICD-10-CM | POA: Insufficient documentation

## 2021-01-18 DIAGNOSIS — E119 Type 2 diabetes mellitus without complications: Secondary | ICD-10-CM | POA: Insufficient documentation

## 2021-01-18 DIAGNOSIS — E663 Overweight: Secondary | ICD-10-CM | POA: Insufficient documentation

## 2021-01-18 LAB — COMPREHENSIVE METABOLIC PANEL
AG Ratio: 2.1 (calc) (ref 1.0–2.5)
ALT: 49 U/L — ABNORMAL HIGH (ref 9–46)
AST: 25 U/L (ref 10–35)
Albumin: 4.8 g/dL (ref 3.6–5.1)
Alkaline phosphatase (APISO): 95 U/L (ref 35–144)
BUN: 12 mg/dL (ref 7–25)
CO2: 24 mmol/L (ref 20–32)
Calcium: 9.7 mg/dL (ref 8.6–10.3)
Chloride: 103 mmol/L (ref 98–110)
Creat: 1.22 mg/dL (ref 0.70–1.30)
Globulin: 2.3 g/dL (calc) (ref 1.9–3.7)
Glucose, Bld: 101 mg/dL (ref 65–139)
Potassium: 3.9 mmol/L (ref 3.5–5.3)
Sodium: 136 mmol/L (ref 135–146)
Total Bilirubin: 0.3 mg/dL (ref 0.2–1.2)
Total Protein: 7.1 g/dL (ref 6.1–8.1)

## 2021-01-18 LAB — LIPID PANEL
Cholesterol: 245 mg/dL — ABNORMAL HIGH (ref <200)
HDL: 38 mg/dL — ABNORMAL LOW (ref >=40)
Non-HDL Cholesterol (Calc): 207 mg/dL (calc) — ABNORMAL HIGH (ref <130)
Total CHOL/HDL Ratio: 6.4 (calc) — ABNORMAL HIGH (ref <5.0)
Triglycerides: 508 mg/dL — ABNORMAL HIGH (ref <150)

## 2021-01-18 LAB — HEMOGLOBIN A1C
Hgb A1c MFr Bld: 6.6 % of total Hgb — ABNORMAL HIGH (ref <5.7)
Mean Plasma Glucose: 143 mg/dL
eAG (mmol/L): 7.9 mmol/L

## 2021-01-18 NOTE — Assessment & Plan Note (Signed)
Encourage diet and exercise weight loss 

## 2021-01-18 NOTE — Assessment & Plan Note (Signed)
He reports he was not aware that he was a diabetic C-Met, lipid and A1c today We will check urine microalbumin at annual exam Encouraged him to consume a low-carb diet and exercise for weight loss We will hold off on medication therapy at this time Encourage routine eye exams Encourage routine foot exams Flu and COVID UTD We will discuss pneumonia vaccine at next visit

## 2021-01-18 NOTE — Assessment & Plan Note (Signed)
C-Met and lipid profile today Will likely need to restart statin therapy Encouraged him to consume a low-fat diet

## 2021-01-19 MED ORDER — ATORVASTATIN CALCIUM 20 MG PO TABS: 20.0000 mg | ORAL_TABLET | Freq: Every day | ORAL | 3 refills | Status: AC

## 2021-01-19 NOTE — Addendum Note (Signed)
Addended by: Lorre Munroe on: 01/19/2021 09:08 AM   Modules accepted: Orders

## 2021-04-22 ENCOUNTER — Encounter: Payer: BC Managed Care – PPO | Admitting: Internal Medicine

## 2021-05-19 ENCOUNTER — Encounter: Payer: BC Managed Care – PPO | Admitting: Internal Medicine

## 2021-06-23 DIAGNOSIS — Z03818 Encounter for observation for suspected exposure to other biological agents ruled out: Secondary | ICD-10-CM | POA: Diagnosis not present

## 2021-06-23 DIAGNOSIS — J101 Influenza due to other identified influenza virus with other respiratory manifestations: Secondary | ICD-10-CM | POA: Diagnosis not present

## 2021-06-27 ENCOUNTER — Encounter: Payer: BC Managed Care – PPO | Admitting: Internal Medicine

## 2021-06-27 ENCOUNTER — Telehealth: Payer: Self-pay | Admitting: Internal Medicine

## 2021-06-27 NOTE — Telephone Encounter (Signed)
Patient called in, he has the flu but still wants to keep the appt today. I told him its really not recommended, but he still wants a call back about the appt today.

## 2021-06-27 NOTE — Telephone Encounter (Signed)
The pt appt was cancelled because he was recently diagnose with influenza.

## 2021-06-27 NOTE — Progress Notes (Deleted)
Subjective:    Patient ID: Bill Hernandez, male    DOB: 29-Sep-1970, 50 y.o.   MRN: [id number]  HPI  Patient presents to clinic today for 47-month follow-up of chronic conditions.  HLD: His last LDL was not calculated, triglycerides 508, simple/1020.  He is not taking Atorvastatin as prescribed.  He does not consume a low-fat diet.  DM2: His last A1c was 6.6%, 01/2021.  He is not taking any oral diabetic medication at this time.  He does not check his sugars.  He has not had an eye exam in >1-year ago.  Flu 09/2020.  Pneumovax never.  COVID Pfizer x3.   Review of Systems     Past Medical History:  Diagnosis Date   Elevated BP without diagnosis of hypertension 03/16/2017   Elevated low density lipoprotein (LDL) cholesterol level 10/30/2016   Tobacco abuse 09/28/2016    Current Outpatient Medications  Medication Sig Dispense Refill   atorvastatin (LIPITOR) 20 MG tablet Take 1 tablet (20 mg total) by mouth daily. 90 tablet 3   No current facility-administered medications for this visit.    No Known Allergies  Family History  Problem Relation Age of Onset   Hypertension Mother    Hyperlipidemia Mother     Social History   Socioeconomic History   Marital status: Single    Spouse name: Not on file   Number of children: Not on file   Years of education: Not on file   Highest education level: Not on file  Occupational History   Not on file  Tobacco Use   Smoking status: Every Day    Packs/day: 0.25    Types: Cigarettes   Smokeless tobacco: Never   Tobacco comments:    Down to 3-4 cigarettes per day   Vaping Use   Vaping Use: Never used  Substance and Sexual Activity   Alcohol use: Yes    Alcohol/week: 2.0 - 3.0 standard drinks    Types: 2 - 3 Cans of beer per week   Drug use: Not Currently    Types: Marijuana    Comment: 1 week ago   Sexual activity: Yes    Birth control/protection: Condom    Comment: not consistently using  Other Topics Concern   Not on file   Social History Narrative   Not on file   Social Determinants of Health   Financial Resource Strain: Not on file  Food Insecurity: Not on file  Transportation Needs: Not on file  Physical Activity: Not on file  Stress: Not on file  Social Connections: Not on file  Intimate Partner Violence: Not on file     Constitutional: Denies fever, malaise, fatigue, headache or abrupt weight changes.  HEENT: Denies eye pain, eye redness, ear pain, ringing in the ears, wax buildup, runny nose, nasal congestion, bloody nose, or sore throat. Respiratory: Denies difficulty breathing, shortness of breath, cough or sputum production.   Cardiovascular: Denies chest pain, chest tightness, palpitations or swelling in the hands or feet.  Gastrointestinal: Denies abdominal pain, bloating, constipation, diarrhea or blood in the stool.  GU: Denies urgency, frequency, pain with urination, burning sensation, blood in urine, odor or discharge. Musculoskeletal: Denies decrease in range of motion, difficulty with gait, muscle pain or joint pain and swelling.  Skin: Denies redness, rashes, lesions or ulcercations.  Neurological: Denies dizziness, difficulty with memory, difficulty with speech or problems with balance and coordination.  Psych: Denies anxiety, depression, SI/HI.  No other specific complaints in a complete  review of systems (except as listed in HPI above).  Objective:   Physical Exam   There were no vitals taken for this visit. Wt Readings from Last 3 Encounters:  01/17/21 219 lb 12.8 oz (99.7 kg)  11/26/20 220 lb 7.4 oz (100 kg)  10/05/20 223 lb 6.4 oz (101.3 kg)    General: Appears their stated age, well developed, well nourished in NAD. Skin: Warm, dry and intact. No rashes, lesions or ulcerations noted. HEENT: Head: normal shape and size; Eyes: sclera white, no icterus, conjunctiva pink, PERRLA and EOMs intact; Ears: Tm's gray and intact, normal light reflex; Nose: mucosa pink and moist,  septum midline; Throat/Mouth: Teeth present, mucosa pink and moist, no exudate, lesions or ulcerations noted.  Neck:  Neck supple, trachea midline. No masses, lumps or thyromegaly present.  Cardiovascular: Normal rate and rhythm. S1,S2 noted.  No murmur, rubs or gallops noted. No JVD or BLE edema. No carotid bruits noted. Pulmonary/Chest: Normal effort and positive vesicular breath sounds. No respiratory distress. No wheezes, rales or ronchi noted.  Abdomen: Soft and nontender. Normal bowel sounds. No distention or masses noted. Liver, spleen and kidneys non palpable. Musculoskeletal: Normal range of motion. No signs of joint swelling. No difficulty with gait.  Neurological: Alert and oriented. Cranial nerves II-XII grossly intact. Coordination normal.  Psychiatric: Mood and affect normal. Behavior is normal. Judgment and thought content normal.    BMET    Component Value Date/Time   NA 136 01/17/2021 1612   K 3.9 01/17/2021 1612   CL 103 01/17/2021 1612   CO2 24 01/17/2021 1612   GLUCOSE 101 01/17/2021 1612   BUN 12 01/17/2021 1612   CREATININE 1.22 01/17/2021 1612   CALCIUM 9.7 01/17/2021 1612   GFRNONAA 75 10/17/2017 0922   GFRAA 87 10/17/2017 0922    Lipid Panel     Component Value Date/Time   CHOL 245 (H) 01/17/2021 1612   TRIG 508 (H) 01/17/2021 1612   HDL 38 (L) 01/17/2021 1612   CHOLHDL 6.4 (H) 01/17/2021 1612   VLDL 30 09/28/2016 0001   LDLCALC  01/17/2021 1612     Comment:     . LDL cholesterol not calculated. Triglyceride levels greater than 400 mg/dL invalidate calculated LDL results. . Reference range: <100 . Desirable range <100 mg/dL for primary prevention;   <70 mg/dL for patients with CHD or diabetic patients  with > or = 2 CHD risk factors. Marland Kitchen LDL-C is now calculated using the Martin-Hopkins  calculation, which is a validated novel method providing  better accuracy than the Friedewald equation in the  estimation of LDL-C.  Horald Pollen et al. Lenox Ahr.  4196;222(97): 2061-2068  (http://education.[facility].com/faq/FAQ164)     CBC    Component Value Date/Time   WBC 7.7 10/08/2020 0756   RBC 5.73 10/08/2020 0756   HGB 15.4 10/08/2020 0756   HCT 46.5 10/08/2020 0756   PLT 218 10/08/2020 0756   MCV 81.2 10/08/2020 0756   MCH 26.9 (L) 10/08/2020 0756   MCHC 33.1 10/08/2020 0756   RDW 12.4 10/08/2020 0756   LYMPHSABS 2,187 10/08/2020 0756   MONOABS 609 09/28/2016 0001   EOSABS 131 10/08/2020 0756   BASOSABS 23 10/08/2020 0756    Hgb A1C Lab Results  Component Value Date   HGBA1C 6.6 (H) 01/17/2021           Assessment & Plan:     Nicki Reaper, NP This visit occurred during the SARS-CoV-2 public health emergency.  Safety protocols were in place,  including screening questions prior to the visit, additional usage of staff PPE, and extensive cleaning of exam room while observing appropriate contact time as indicated for disinfecting solutions.

## 2021-07-08 ENCOUNTER — Encounter: Payer: BC Managed Care – PPO | Admitting: Internal Medicine

## 2021-07-08 ENCOUNTER — Other Ambulatory Visit: Payer: Self-pay

## 2021-07-08 NOTE — Progress Notes (Deleted)
Subjective:    Patient ID: Bill Hernandez, male    DOB: [birth year], 50 y.o.   MRN: [id number]  HPI  Patient presents the clinic today for his annual exam.  Flu: 09/2020 Tetanus: 10/2017 COVID: Pfizer x3 Pneumovax: PSA screening: 10/2020 Colon screening: 11/2020 Vision screening: Dentist:  Diet: Exercise:   Review of Systems     Past Medical History:  Diagnosis Date   Elevated BP without diagnosis of hypertension 03/16/2017   Elevated low density lipoprotein (LDL) cholesterol level 10/30/2016   Tobacco abuse 09/28/2016    Current Outpatient Medications  Medication Sig Dispense Refill   atorvastatin (LIPITOR) 20 MG tablet Take 1 tablet (20 mg total) by mouth daily. 90 tablet 3   No current facility-administered medications for this visit.    No Known Allergies  Family History  Problem Relation Age of Onset   Hypertension Mother    Hyperlipidemia Mother     Social History   Socioeconomic History   Marital status: Single    Spouse name: Not on file   Number of children: Not on file   Years of education: Not on file   Highest education level: Not on file  Occupational History   Not on file  Tobacco Use   Smoking status: Every Day    Packs/day: 0.25    Types: Cigarettes   Smokeless tobacco: Never   Tobacco comments:    Down to 3-4 cigarettes per day   Vaping Use   Vaping Use: Never used  Substance and Sexual Activity   Alcohol use: Yes    Alcohol/week: 2.0 - 3.0 standard drinks    Types: 2 - 3 Cans of beer per week   Drug use: Not Currently    Types: Marijuana    Comment: 1 week ago   Sexual activity: Yes    Birth control/protection: Condom    Comment: not consistently using  Other Topics Concern   Not on file  Social History Narrative   Not on file   Social Determinants of Health   Financial Resource Strain: Not on file  Food Insecurity: Not on file  Transportation Needs: Not on file  Physical Activity: Not on file  Stress: Not on file   Social Connections: Not on file  Intimate Partner Violence: Not on file     Constitutional: Denies fever, malaise, fatigue, headache or abrupt weight changes.  HEENT: Denies eye pain, eye redness, ear pain, ringing in the ears, wax buildup, runny nose, nasal congestion, bloody nose, or sore throat. Respiratory: Denies difficulty breathing, shortness of breath, cough or sputum production.   Cardiovascular: Denies chest pain, chest tightness, palpitations or swelling in the hands or feet.  Gastrointestinal: Denies abdominal pain, bloating, constipation, diarrhea or blood in the stool.  GU: Denies urgency, frequency, pain with urination, burning sensation, blood in urine, odor or discharge. Musculoskeletal: Denies decrease in range of motion, difficulty with gait, muscle pain or joint pain and swelling.  Skin: Denies redness, rashes, lesions or ulcercations.  Neurological: Denies dizziness, difficulty with memory, difficulty with speech or problems with balance and coordination.  Psych: Denies anxiety, depression, SI/HI.  No other specific complaints in a complete review of systems (except as listed in HPI above).  Objective:   Physical Exam There were no vitals taken for this visit. Wt Readings from Last 3 Encounters:  01/17/21 219 lb 12.8 oz (99.7 kg)  11/26/20 220 lb 7.4 oz (100 kg)  10/05/20 223 lb 6.4 oz (101.3 kg)  General: Appears their stated age, well developed, well nourished in NAD. Skin: Warm, dry and intact. No rashes, lesions or ulcerations noted. HEENT: Head: normal shape and size; Eyes: sclera white, no icterus, conjunctiva pink, PERRLA and EOMs intact; Ears: Tm's gray and intact, normal light reflex; Nose: mucosa pink and moist, septum midline; Throat/Mouth: Teeth present, mucosa pink and moist, no exudate, lesions or ulcerations noted.  Neck:  Neck supple, trachea midline. No masses, lumps or thyromegaly present.  Cardiovascular: Normal rate and rhythm. S1,S2 noted.   No murmur, rubs or gallops noted. No JVD or BLE edema. No carotid bruits noted. Pulmonary/Chest: Normal effort and positive vesicular breath sounds. No respiratory distress. No wheezes, rales or ronchi noted.  Abdomen: Soft and nontender. Normal bowel sounds. No distention or masses noted. Liver, spleen and kidneys non palpable. Musculoskeletal: Normal range of motion. No signs of joint swelling. No difficulty with gait.  Neurological: Alert and oriented. Cranial nerves II-XII grossly intact. Coordination normal.  Psychiatric: Mood and affect normal. Behavior is normal. Judgment and thought content normal.    BMET    Component Value Date/Time   NA 136 01/17/2021 1612   K 3.9 01/17/2021 1612   CL 103 01/17/2021 1612   CO2 24 01/17/2021 1612   GLUCOSE 101 01/17/2021 1612   BUN 12 01/17/2021 1612   CREATININE 1.22 01/17/2021 1612   CALCIUM 9.7 01/17/2021 1612   GFRNONAA 75 10/17/2017 0922   GFRAA 87 10/17/2017 0922    Lipid Panel     Component Value Date/Time   CHOL 245 (H) 01/17/2021 1612   TRIG 508 (H) 01/17/2021 1612   HDL 38 (L) 01/17/2021 1612   CHOLHDL 6.4 (H) 01/17/2021 1612   VLDL 30 09/28/2016 0001   LDLCALC  01/17/2021 1612     Comment:     . LDL cholesterol not calculated. Triglyceride levels greater than 400 mg/dL invalidate calculated LDL results. . Reference range: <100 . Desirable range <100 mg/dL for primary prevention;   <70 mg/dL for patients with CHD or diabetic patients  with > or = 2 CHD risk factors. Marland Kitchen LDL-C is now calculated using the Martin-Hopkins  calculation, which is a validated novel method providing  better accuracy than the Friedewald equation in the  estimation of LDL-C.  Cresenciano Genre et al. Annamaria Helling. 1610;960(45): 2061-2068  (http://education.[facility].com/faq/FAQ164)     CBC    Component Value Date/Time   WBC 7.7 10/08/2020 0756   RBC 5.73 10/08/2020 0756   HGB 15.4 10/08/2020 0756   HCT 46.5 10/08/2020 0756   PLT 218  10/08/2020 0756   MCV 81.2 10/08/2020 0756   MCH 26.9 (L) 10/08/2020 0756   MCHC 33.1 10/08/2020 0756   RDW 12.4 10/08/2020 0756   LYMPHSABS 2,187 10/08/2020 0756   MONOABS 609 09/28/2016 0001   EOSABS 131 10/08/2020 0756   BASOSABS 23 10/08/2020 0756    Hgb A1C Lab Results  Component Value Date   HGBA1C 6.6 (H) 01/17/2021             Assessment & Plan:   Preventative Health Maintenance:  Flu shot today Tetanus UTD Encouraged him to get his COVID booster Pneumovax today Colon screening UTD Encouraged him to consume a balanced diet and exercise regimen Advised him to see an eye doctor and dentist annually We will check CBC, c-Met, lipid, A1c, urine microalbumin and hep C today  RTC in 6 months, follow-up chronic conditions Webb Silversmith, NP This visit occurred during the SARS-CoV-2 public health emergency.  Safety  Safety protocols were in place, including screening questions prior to the visit, additional usage of staff PPE, and extensive cleaning of exam room while observing appropriate contact time as indicated for disinfecting solutions.

## 2021-11-18 ENCOUNTER — Encounter: Payer: Self-pay | Admitting: *Deleted

## 2021-11-18 DIAGNOSIS — Z006 Encounter for examination for normal comparison and control in clinical research program: Secondary | ICD-10-CM

## 2021-11-18 NOTE — Research (Signed)
Message left for Bill Hernandez to inform him of [employer] research study, encouraged him to call back.  ?

## 2021-12-20 DIAGNOSIS — M5416 Radiculopathy, lumbar region: Secondary | ICD-10-CM | POA: Diagnosis not present

## 2021-12-20 DIAGNOSIS — M545 Low back pain, unspecified: Secondary | ICD-10-CM | POA: Diagnosis not present

## 2021-12-20 DIAGNOSIS — S39012A Strain of muscle, fascia and tendon of lower back, initial encounter: Secondary | ICD-10-CM | POA: Diagnosis not present

## 2023-04-09 ENCOUNTER — Ambulatory Visit: Payer: BC Managed Care – PPO | Admitting: Internal Medicine

## 2023-04-13 ENCOUNTER — Ambulatory Visit (INDEPENDENT_AMBULATORY_CARE_PROVIDER_SITE_OTHER): Payer: 59 | Admitting: Internal Medicine

## 2023-04-13 ENCOUNTER — Encounter: Payer: Self-pay | Admitting: Internal Medicine

## 2023-04-13 VITALS — BP 132/88 | HR 82 | Temp 96.0°F | Ht 72.0 in | Wt 198.0 lb

## 2023-04-13 DIAGNOSIS — Z6826 Body mass index (BMI) 26.0-26.9, adult: Secondary | ICD-10-CM | POA: Diagnosis not present

## 2023-04-13 DIAGNOSIS — E1169 Type 2 diabetes mellitus with other specified complication: Secondary | ICD-10-CM

## 2023-04-13 DIAGNOSIS — E785 Hyperlipidemia, unspecified: Secondary | ICD-10-CM

## 2023-04-13 DIAGNOSIS — E1165 Type 2 diabetes mellitus with hyperglycemia: Secondary | ICD-10-CM | POA: Diagnosis not present

## 2023-04-13 DIAGNOSIS — E663 Overweight: Secondary | ICD-10-CM | POA: Diagnosis not present

## 2023-04-13 DIAGNOSIS — Z23 Encounter for immunization: Secondary | ICD-10-CM

## 2023-04-13 LAB — POCT GLYCOSYLATED HEMOGLOBIN (HGB A1C): Hemoglobin A1C: 6 % — AB (ref 4.0–5.6)

## 2023-04-13 NOTE — Progress Notes (Signed)
Subjective:    Patient ID: Bill Hernandez, male    DOB: 10-Aug-1970, 52 y.o.   MRN: [id number]  HPI  Patient presents to clinic today for follow-up of chronic conditions.  DM2: His last A1c was 6.6%, 01/2021.  He is not taking any oral diabetic medication at this time.  He does not check his sugars.  He does not check his feet routinely.  His last eye exam was > 1 year ago.  Flu 09/2020.  Pneumovax never.  COVID x 3.  HLD: His last LDL was not calculated, triglycerides 508, 01/2021.  He is not taking atorvastatin as prescribed.  He does not consume a low-fat diet.  Review of Systems  Past Medical History:  Diagnosis Date   Elevated BP without diagnosis of hypertension 03/16/2017   Elevated low density lipoprotein (LDL) cholesterol level 10/30/2016   Tobacco abuse 09/28/2016    Current Outpatient Medications  Medication Sig Dispense Refill   atorvastatin (LIPITOR) 20 MG tablet Take 1 tablet (20 mg total) by mouth daily. 90 tablet 3   No current facility-administered medications for this visit.    No Known Allergies  Family History  Problem Relation Age of Onset   Hypertension Mother    Hyperlipidemia Mother     Social History   Socioeconomic History   Marital status: Single    Spouse name: Not on file   Number of children: Not on file   Years of education: Not on file   Highest education level: 12th grade  Occupational History   Not on file  Tobacco Use   Smoking status: Every Day    Current packs/day: 0.25    Types: Cigarettes   Smokeless tobacco: Never   Tobacco comments:    Down to 3-4 cigarettes per day   Vaping Use   Vaping status: Never Used  Substance and Sexual Activity   Alcohol use: Yes    Alcohol/week: 2.0 - 3.0 standard drinks of alcohol    Types: 2 - 3 Cans of beer per week   Drug use: Not Currently    Types: Marijuana    Comment: 1 week ago   Sexual activity: Yes    Birth control/protection: Condom    Comment: not consistently using  Other  Topics Concern   Not on file  Social History Narrative   Not on file   Social Determinants of Health   Financial Resource Strain: Medium Risk (04/09/2023)   Overall Financial Resource Strain (CARDIA)    Difficulty of Paying Living Expenses: Somewhat hard  Food Insecurity: Food Insecurity Present (04/09/2023)   Hunger Vital Sign    Worried About Running Out of Food in the Last Year: Sometimes true    Ran Out of Food in the Last Year: Sometimes true  Transportation Needs: No Transportation Needs (04/09/2023)   PRAPARE - Administrator, Civil Service (Medical): No    Lack of Transportation (Non-Medical): No  Physical Activity: Insufficiently Active (04/09/2023)   Exercise Vital Sign    Days of Exercise per Week: 3 days    Minutes of Exercise per Session: 40 min  Stress: No Stress Concern Present (04/09/2023)   [employer] of Occupational Health - Occupational Stress Questionnaire    Feeling of Stress : Not at all  Social Connections: Socially Isolated (04/09/2023)   Social Connection and Isolation Panel [NHANES]    Frequency of Communication with Friends and Family: More than three times a week    Frequency of Social Gatherings  with Friends and Family: More than three times a week    Attends Religious Services: Never    Active Member of Clubs or Organizations: No    Attends Engineer, structural: Not on file    Marital Status: Divorced  Intimate Partner Violence: Not on file     Constitutional: Denies fever, malaise, fatigue, headache or abrupt weight changes.  HEENT: Denies eye pain, eye redness, ear pain, ringing in the ears, wax buildup, runny nose, nasal congestion, bloody nose, or sore throat. Respiratory: Denies difficulty breathing, shortness of breath, cough or sputum production.   Cardiovascular: Denies chest pain, chest tightness, palpitations or swelling in the hands or feet.  Gastrointestinal: Denies abdominal pain, bloating, constipation, diarrhea  or blood in the stool.  GU: Denies urgency, frequency, pain with urination, burning sensation, blood in urine, odor or discharge. Musculoskeletal: Denies decrease in range of motion, difficulty with gait, muscle pain or joint pain and swelling.  Skin: Denies redness, rashes, lesions or ulcercations.  Neurological: Denies dizziness, difficulty with memory, difficulty with speech or problems with balance and coordination.  Psych: Denies anxiety, depression, SI/HI.  No other specific complaints in a complete review of systems (except as listed in HPI above).     Objective:   Physical Exam  BP 132/88 (BP Location: Left Arm, Patient Position: Sitting, Cuff Size: Normal)   Pulse 82   Temp (!) 96 F (35.6 C) (Temporal)   Ht 6' (1.829 m)   Wt 198 lb (89.8 kg)   SpO2 97%   BMI 26.85 kg/m   Wt Readings from Last 3 Encounters:  01/17/21 219 lb 12.8 oz (99.7 kg)  11/26/20 220 lb 7.4 oz (100 kg)  10/05/20 223 lb 6.4 oz (101.3 kg)    General: Appears his stated age, overweight, in NAD. Skin: Warm, dry and intact. No ulcerations noted. HEENT: Head: normal shape and size; Eyes: sclera white, no icterus, conjunctiva pink, PERRLA and EOMs intact;  Neck:  Neck supple, trachea midline. No masses, lumps or thyromegaly present.  Cardiovascular: Normal rate and rhythm. S1,S2 noted.  No murmur, rubs or gallops noted. No JVD or BLE edema. No carotid bruits noted. Pulmonary/Chest: Normal effort and positive vesicular breath sounds. No respiratory distress. No wheezes, rales or ronchi noted.  Neurological: Alert and oriented. Coordination normal.    BMET    Component Value Date/Time   NA 136 01/17/2021 1612   K 3.9 01/17/2021 1612   CL 103 01/17/2021 1612   CO2 24 01/17/2021 1612   GLUCOSE 101 01/17/2021 1612   BUN 12 01/17/2021 1612   CREATININE 1.22 01/17/2021 1612   CALCIUM 9.7 01/17/2021 1612   GFRNONAA 75 10/17/2017 0922   GFRAA 87 10/17/2017 0922    Lipid Panel     Component Value  Date/Time   CHOL 245 (H) 01/17/2021 1612   TRIG 508 (H) 01/17/2021 1612   HDL 38 (L) 01/17/2021 1612   CHOLHDL 6.4 (H) 01/17/2021 1612   VLDL 30 09/28/2016 0001   LDLCALC  01/17/2021 1612     Comment:     . LDL cholesterol not calculated. Triglyceride levels greater than 400 mg/dL invalidate calculated LDL results. . Reference range: <100 . Desirable range <100 mg/dL for primary prevention;   <70 mg/dL for patients with CHD or diabetic patients  with > or = 2 CHD risk factors. Marland Kitchen LDL-C is now calculated using the Martin-Hopkins  calculation, which is a validated novel method providing  better accuracy than the Friedewald equation in  the  estimation of LDL-C.  Horald Pollen et al. Lenox Ahr. 0272;536(64): 2061-2068  (http://education.[facility].com/faq/FAQ164)     CBC    Component Value Date/Time   WBC 7.7 10/08/2020 0756   RBC 5.73 10/08/2020 0756   HGB 15.4 10/08/2020 0756   HCT 46.5 10/08/2020 0756   PLT 218 10/08/2020 0756   MCV 81.2 10/08/2020 0756   MCH 26.9 (L) 10/08/2020 0756   MCHC 33.1 10/08/2020 0756   RDW 12.4 10/08/2020 0756   LYMPHSABS 2,187 10/08/2020 0756   MONOABS 609 09/28/2016 0001   EOSABS 131 10/08/2020 0756   BASOSABS 23 10/08/2020 0756    Hgb A1C Lab Results  Component Value Date   HGBA1C 6.6 (H) 01/17/2021           Assessment & Plan:      RTC in 6 months for your annual exam Nicki Reaper, NP

## 2023-04-13 NOTE — Assessment & Plan Note (Signed)
POCT A1c 6% We will check urine microalbumin Encouraged him to consume a low-carb diet and exercise for weight loss Referral to ophthalmology for diabetic eye exam Encouraged routine foot exam Flu shot today

## 2023-04-13 NOTE — Patient Instructions (Signed)

## 2023-04-13 NOTE — Assessment & Plan Note (Signed)
C-Met and lipid profile today Encouraged him to consume a low-fat diet 

## 2023-04-13 NOTE — Assessment & Plan Note (Signed)
Encourage diet and exercise for weight loss 

## 2023-10-12 ENCOUNTER — Encounter: Payer: Self-pay | Admitting: Internal Medicine

## 2023-10-12 NOTE — Progress Notes (Deleted)
 Subjective:    Patient ID: Bill Hernandez, male    DOB: 03-28-1971, 53 y.o.   MRN: [id number]  HPI  Patient presents to clinic today for his annual exam.  Flu: 04/2023 Tetanus: 10/2017 COVID: X 3 Shingrix: PSA screening: 10/2020 Colon screening: 11/2020 Vision screening: Dentist:  Diet: Exercise:  Review of Systems   Past Medical History:  Diagnosis Date   Elevated BP without diagnosis of hypertension 03/16/2017   Elevated low density lipoprotein (LDL) cholesterol level 10/30/2016   Tobacco abuse 09/28/2016    Current Outpatient Medications  Medication Sig Dispense Refill   atorvastatin (LIPITOR) 20 MG tablet Take 1 tablet (20 mg total) by mouth daily. (Patient not taking: Reported on 04/13/2023) 90 tablet 3   No current facility-administered medications for this visit.    No Known Allergies  Family History  Problem Relation Age of Onset   Hypertension Mother    Hyperlipidemia Mother     Social History   Socioeconomic History   Marital status: Single    Spouse name: Not on file   Number of children: Not on file   Years of education: Not on file   Highest education level: 12th grade  Occupational History   Not on file  Tobacco Use   Smoking status: Every Day    Current packs/day: 0.25    Types: Cigarettes   Smokeless tobacco: Never   Tobacco comments:    Down to 3-4 cigarettes per day   Vaping Use   Vaping status: Never Used  Substance and Sexual Activity   Alcohol use: Yes    Alcohol/week: 2.0 - 3.0 standard drinks of alcohol    Types: 2 - 3 Cans of beer per week   Drug use: Not Currently    Types: Marijuana    Comment: 1 week ago   Sexual activity: Yes    Birth control/protection: Condom    Comment: not consistently using  Other Topics Concern   Not on file  Social History Narrative   Not on file   Social Drivers of Health   Financial Resource Strain: Medium Risk (04/09/2023)   Overall Financial Resource Strain (CARDIA)    Difficulty of  Paying Living Expenses: Somewhat hard  Food Insecurity: Food Insecurity Present (04/09/2023)   Hunger Vital Sign    Worried About Running Out of Food in the Last Year: Sometimes true    Ran Out of Food in the Last Year: Sometimes true  Transportation Needs: No Transportation Needs (04/09/2023)   PRAPARE - Administrator, Civil Service (Medical): No    Lack of Transportation (Non-Medical): No  Physical Activity: Insufficiently Active (04/09/2023)   Exercise Vital Sign    Days of Exercise per Week: 3 days    Minutes of Exercise per Session: 40 min  Stress: No Stress Concern Present (04/09/2023)   [employer] of Occupational Health - Occupational Stress Questionnaire    Feeling of Stress : Not at all  Social Connections: Socially Isolated (04/09/2023)   Social Connection and Isolation Panel [NHANES]    Frequency of Communication with Friends and Family: More than three times a week    Frequency of Social Gatherings with Friends and Family: More than three times a week    Attends Religious Services: Never    Database administrator or Organizations: No    Attends Engineer, structural: Not on file    Marital Status: Divorced  Intimate Partner Violence: Not on file  Constitutional: Denies fever, malaise, fatigue, headache or abrupt weight changes.  HEENT: Denies eye pain, eye redness, ear pain, ringing in the ears, wax buildup, runny nose, nasal congestion, bloody nose, or sore throat. Respiratory: Denies difficulty breathing, shortness of breath, cough or sputum production.   Cardiovascular: Denies chest pain, chest tightness, palpitations or swelling in the hands or feet.  Gastrointestinal: Denies abdominal pain, bloating, constipation, diarrhea or blood in the stool.  GU: Denies urgency, frequency, pain with urination, burning sensation, blood in urine, odor or discharge. Musculoskeletal: Denies decrease in range of motion, difficulty with gait, muscle pain or  joint pain and swelling.  Skin: Denies redness, rashes, lesions or ulcercations.  Neurological: Denies dizziness, difficulty with memory, difficulty with speech or problems with balance and coordination.  Psych: Denies anxiety, depression, SI/HI.  No other specific complaints in a complete review of systems (except as listed in HPI above).      Objective:   Physical Exam  There were no vitals taken for this visit. Wt Readings from Last 3 Encounters:  04/13/23 198 lb (89.8 kg)  01/17/21 219 lb 12.8 oz (99.7 kg)  11/26/20 220 lb 7.4 oz (100 kg)    General: Appears their stated age, well developed, well nourished in NAD. Skin: Warm, dry and intact. No rashes, lesions or ulcerations noted. HEENT: Head: normal shape and size; Eyes: sclera white, no icterus, conjunctiva pink, PERRLA and EOMs intact; Ears: Tm's gray and intact, normal light reflex; Nose: mucosa pink and moist, septum midline; Throat/Mouth: Teeth present, mucosa pink and moist, no exudate, lesions or ulcerations noted.  Neck:  Neck supple, trachea midline. No masses, lumps or thyromegaly present.  Cardiovascular: Normal rate and rhythm. S1,S2 noted.  No murmur, rubs or gallops noted. No JVD or BLE edema. No carotid bruits noted. Pulmonary/Chest: Normal effort and positive vesicular breath sounds. No respiratory distress. No wheezes, rales or ronchi noted.  Abdomen: Soft and nontender. Normal bowel sounds. No distention or masses noted. Liver, spleen and kidneys non palpable. Musculoskeletal: Normal range of motion. No signs of joint swelling. No difficulty with gait.  Neurological: Alert and oriented. Cranial nerves II-XII grossly intact. Coordination normal.  Psychiatric: Mood and affect normal. Behavior is normal. Judgment and thought content normal.    BMET    Component Value Date/Time   NA 136 01/17/2021 1612   K 3.9 01/17/2021 1612   CL 103 01/17/2021 1612   CO2 24 01/17/2021 1612   GLUCOSE 101 01/17/2021 1612    BUN 12 01/17/2021 1612   CREATININE 1.22 01/17/2021 1612   CALCIUM 9.7 01/17/2021 1612   GFRNONAA 75 10/17/2017 0922   GFRAA 87 10/17/2017 0922    Lipid Panel     Component Value Date/Time   CHOL 245 (H) 01/17/2021 1612   TRIG 508 (H) 01/17/2021 1612   HDL 38 (L) 01/17/2021 1612   CHOLHDL 6.4 (H) 01/17/2021 1612   VLDL 30 09/28/2016 0001   LDLCALC  01/17/2021 1612     Comment:     . LDL cholesterol not calculated. Triglyceride levels greater than 400 mg/dL invalidate calculated LDL results. . Reference range: <100 . Desirable range <100 mg/dL for primary prevention;   <70 mg/dL for patients with CHD or diabetic patients  with > or = 2 CHD risk factors. Marland Kitchen LDL-C is now calculated using the Martin-Hopkins  calculation, which is a validated novel method providing  better accuracy than the Friedewald equation in the  estimation of LDL-C.  Horald Pollen et al. Lenox Ahr.  1914;782(95): 2061-2068  (http://education.[facility].com/faq/FAQ164)     CBC    Component Value Date/Time   WBC 7.7 10/08/2020 0756   RBC 5.73 10/08/2020 0756   HGB 15.4 10/08/2020 0756   HCT 46.5 10/08/2020 0756   PLT 218 10/08/2020 0756   MCV 81.2 10/08/2020 0756   MCH 26.9 (L) 10/08/2020 0756   MCHC 33.1 10/08/2020 0756   RDW 12.4 10/08/2020 0756   LYMPHSABS 2,187 10/08/2020 0756   MONOABS 609 09/28/2016 0001   EOSABS 131 10/08/2020 0756   BASOSABS 23 10/08/2020 0756    Hgb A1C Lab Results  Component Value Date   HGBA1C 6.0 (A) 04/13/2023            Assessment & Plan:   Preventative health maintenance:  Encouraged him to get a flu shot in the fall Tetanus UTD Encouraged him to get his COVID booster Pneumovax Discussed Shingrix vaccine, he will check coverage with his insurance company and schedule visit if he would like to have this done Referral to GI for screening colonoscopy Encouraged him to consume a balanced diet and exercise regimen Advised him to see an eye doctor and  dentist annually Will check CBC, c-Met, lipid, A1c, urine microalbumin and PSA today  RTC in 6 months, follow-up chronic conditions Nicki Reaper, NP

## 2023-12-06 ENCOUNTER — Ambulatory Visit: Admitting: Internal Medicine

## 2024-01-04 ENCOUNTER — Ambulatory Visit (INDEPENDENT_AMBULATORY_CARE_PROVIDER_SITE_OTHER): Admitting: Internal Medicine

## 2024-01-04 ENCOUNTER — Encounter: Payer: Self-pay | Admitting: Internal Medicine

## 2024-01-04 VITALS — BP 130/82 | Ht 72.0 in | Wt 190.0 lb

## 2024-01-04 DIAGNOSIS — M5441 Lumbago with sciatica, right side: Secondary | ICD-10-CM | POA: Diagnosis not present

## 2024-01-04 MED ORDER — PREDNISONE 10 MG PO TABS: ORAL_TABLET | ORAL | 0 refills | Status: DC

## 2024-01-04 MED ORDER — KETOROLAC TROMETHAMINE 30 MG/ML IJ SOLN
30.0000 mg | Freq: Once | INTRAMUSCULAR | Status: AC
  Administered 2024-01-04: 30 mg via INTRAMUSCULAR

## 2024-01-04 MED ORDER — METHOCARBAMOL 500 MG PO TABS: 500.0000 mg | ORAL_TABLET | Freq: Three times a day (TID) | ORAL | 0 refills | Status: DC | PRN

## 2024-01-04 NOTE — Patient Instructions (Signed)

## 2024-01-04 NOTE — Progress Notes (Unsigned)
 Subjective:    Patient ID: Bill Hernandez, male    DOB: [birth year], 53 y.o.   MRN: [id number]  HPI  Discussed the use of AI scribe software for clinical note transcription with the patient, who gave verbal consent to proceed.  Bill Hernandez is a 53 year old male who presents with back and hip pain radiating down the leg.  He has been experiencing back and side hip pain for approximately two weeks, with similar issues occurring over the past couple of years. The pain is primarily located in the hip area and is described as sharp and stabbing, making it difficult for him to stand. It radiates down the leg to the ankle, accompanied by numbness, tingling, and a burning sensation. No bowel or bladder problems are reported, and there is no history of recent back injuries or surgeries.  For symptom management, he has been using ice, heat, Biofreeze, patches, and occasionally takes Tylenol . He has not been on any prescribed medications for this issue prior to this visit.  He mentions that he has been trying to perform some activities at work despite the pain.       Review of Systems  Past Medical History:  Diagnosis Date   Elevated BP without diagnosis of hypertension 03/16/2017   Elevated low density lipoprotein (LDL) cholesterol level 10/30/2016   Tobacco abuse 09/28/2016    Current Outpatient Medications  Medication Sig Dispense Refill   atorvastatin  (LIPITOR) 20 MG tablet Take 1 tablet (20 mg total) by mouth daily. (Patient not taking: Reported on 04/13/2023) 90 tablet 3   No current facility-administered medications for this visit.    No Known Allergies  Family History  Problem Relation Age of Onset   Hypertension Mother    Hyperlipidemia Mother     Social History   Socioeconomic History   Marital status: Single    Spouse name: Not on file   Number of children: Not on file   Years of education: Not on file   Highest education level: 12th grade  Occupational  History   Not on file  Tobacco Use   Smoking status: Every Day    Current packs/day: 0.25    Types: Cigarettes   Smokeless tobacco: Never   Tobacco comments:    Down to 3-4 cigarettes per day   Vaping Use   Vaping status: Never Used  Substance and Sexual Activity   Alcohol use: Yes    Alcohol/week: 2.0 - 3.0 standard drinks of alcohol    Types: 2 - 3 Cans of beer per week   Drug use: Not Currently    Types: Marijuana    Comment: 1 week ago   Sexual activity: Yes    Birth control/protection: Condom    Comment: not consistently using  Other Topics Concern   Not on file  Social History Narrative   Not on file   Social Drivers of Health   Financial Resource Strain: Medium Risk (04/09/2023)   Overall Financial Resource Strain (CARDIA)    Difficulty of Paying Living Expenses: Somewhat hard  Food Insecurity: Food Insecurity Present (04/09/2023)   Hunger Vital Sign    Worried About Running Out of Food in the Last Year: Sometimes true    Ran Out of Food in the Last Year: Sometimes true  Transportation Needs: No Transportation Needs (04/09/2023)   PRAPARE - Administrator, Civil Service (Medical): No    Lack of Transportation (Non-Medical): No  Physical Activity: Insufficiently Active (04/09/2023)  Exercise Vital Sign    Days of Exercise per Week: 3 days    Minutes of Exercise per Session: 40 min  Stress: No Stress Concern Present (04/09/2023)   [employer] of Occupational Health - Occupational Stress Questionnaire    Feeling of Stress : Not at all  Social Connections: Socially Isolated (04/09/2023)   Social Connection and Isolation Panel    Frequency of Communication with Friends and Family: More than three times a week    Frequency of Social Gatherings with Friends and Family: More than three times a week    Attends Religious Services: Never    Database administrator or Organizations: No    Attends Engineer, structural: Not on file    Marital Status:  Divorced  Intimate Partner Violence: Not on file     Constitutional: Denies fever, malaise, fatigue, headache or abrupt weight changes.  Respiratory: Denies difficulty breathing, shortness of breath, cough or sputum production.   Cardiovascular: Denies chest pain, chest tightness, palpitations or swelling in the hands or feet.  Gastrointestinal: Denies loss of bowel control.  GU: Denies loss of bladder control. Musculoskeletal: Patient reports right side low back pain.  Denies decrease in range of motion, difficulty with gait, or joint swelling.  Skin: Denies redness, rashes, lesions or ulcercations.  Neurological: Pt reports paresthesia of right low extremity. Denies dizziness, difficulty with memory, difficulty with speech or problems with balance and coordination.    No other specific complaints in a complete review of systems (except as listed in HPI above).     Objective:   Physical Exam BP 130/82 (BP Location: Left Arm, Patient Position: Sitting, Cuff Size: Normal)   Ht 6' (1.829 m)   Wt 190 lb (86.2 kg)   BMI 25.77 kg/m    Wt Readings from Last 3 Encounters:  04/13/23 198 lb (89.8 kg)  01/17/21 219 lb 12.8 oz (99.7 kg)  11/26/20 220 lb 7.4 oz (100 kg)    General: Appears his stated age, overweight, in NAD. Skin: Warm, dry and intact. No ulcerations noted. Cardiovascular: Normal rate and rhythm.  Pulmonary/Chest: Normal effort and positive vesicular breath sounds. No respiratory distress. No wheezes, rales or ronchi noted.  MSK: Decreased flexion and extension of the spine secondary to pain.  Normal rotation lateral bending of the spine.  No bony tenderness noted over the spine but pain with palpation of the right SI joint.  Able to stand on tiptoes and heels.  Normal abduction, adduction of the right hip.  Pain with palpation of the right trochanter.  Strength 3/5 RLE, 4/5 LLE.  Limping gait without device. Neurological: Alert and oriented.  Positive SLR on the right at  30 degrees.   BMET    Component Value Date/Time   NA 136 01/17/2021 1612   K 3.9 01/17/2021 1612   CL 103 01/17/2021 1612   CO2 24 01/17/2021 1612   GLUCOSE 101 01/17/2021 1612   BUN 12 01/17/2021 1612   CREATININE 1.22 01/17/2021 1612   CALCIUM  9.7 01/17/2021 1612   GFRNONAA 75 10/17/2017 0922   GFRAA 87 10/17/2017 0922    Lipid Panel     Component Value Date/Time   CHOL 245 (H) 01/17/2021 1612   TRIG 508 (H) 01/17/2021 1612   HDL 38 (L) 01/17/2021 1612   CHOLHDL 6.4 (H) 01/17/2021 1612   VLDL 30 09/28/2016 0001   LDLCALC  01/17/2021 1612     Comment:     . LDL cholesterol not calculated. Triglyceride  levels greater than 400 mg/dL invalidate calculated LDL results. . Reference range: <100 . Desirable range <100 mg/dL for primary prevention;   <70 mg/dL for patients with CHD or diabetic patients  with > or = 2 CHD risk factors. SABRA LDL-C is now calculated using the Martin-Hopkins  calculation, which is a validated novel method providing  better accuracy than the Friedewald equation in the  estimation of LDL-C.  Gladis APPLETHWAITE et al. SANDREA. 7986;689(80): 2061-2068  (http://education.[facility].com/faq/FAQ164)     CBC    Component Value Date/Time   WBC 7.7 10/08/2020 0756   RBC 5.73 10/08/2020 0756   HGB 15.4 10/08/2020 0756   HCT 46.5 10/08/2020 0756   PLT 218 10/08/2020 0756   MCV 81.2 10/08/2020 0756   MCH 26.9 (L) 10/08/2020 0756   MCHC 33.1 10/08/2020 0756   RDW 12.4 10/08/2020 0756   LYMPHSABS 2,187 10/08/2020 0756   MONOABS 609 09/28/2016 0001   EOSABS 131 10/08/2020 0756   BASOSABS 23 10/08/2020 0756    Hgb A1C Lab Results  Component Value Date   HGBA1C 6.0 (A) 04/13/2023           Assessment & Plan:    Assessment and Plan    Sciatica Severe right hip to ankle pain with numbness and tingling, consistent with sciatica. Suspected sciatic nerve involvement. Discussed treatment options and imaging if no improvement. - Administer  Toradol 30 mg IM for pain relief. - Prescribe 9-day steroid taper. - Prescribe methocarbamol 500 mg every 8 hours as needed, caution with drowsiness. - Advise alternating heat and ice application. - Plan spinal x-ray and consider physical therapy if no improvement. - Discuss potential MRI if symptoms persist after x-ray and physical therapy.    Schedule an appointment for your annual exam Angeline Laura, NP

## 2024-01-10 ENCOUNTER — Telehealth: Payer: Self-pay

## 2024-01-10 NOTE — Telephone Encounter (Signed)
 Copied from CRM [id number]. Topic: Clinical - Medical Advice >> Jan 10, 2024  3:59 PM Delon DASEN wrote: Reason for CRM: right hip pain is worse, was told to call if it did not improve- level 8-9 [phone]

## 2024-01-14 NOTE — Telephone Encounter (Signed)
 The patient called in stating he wanted to proceed with x rays and after speaking with Vernell I told him the provider would not be back until next week so he is going to call the Urgent [facility] on [address] and see if he goes there he can also get  xrays done.

## 2024-01-15 ENCOUNTER — Ambulatory Visit (INDEPENDENT_AMBULATORY_CARE_PROVIDER_SITE_OTHER)

## 2024-01-15 ENCOUNTER — Ambulatory Visit (HOSPITAL_COMMUNITY): Payer: Self-pay

## 2024-01-15 ENCOUNTER — Ambulatory Visit
Admission: EM | Admit: 2024-01-15 | Discharge: 2024-01-15 | Disposition: A | Discharge status: Home / Self Care | Source: Ambulatory Visit | Attending: Emergency Medicine | Admitting: Emergency Medicine

## 2024-01-15 DIAGNOSIS — M5441 Lumbago with sciatica, right side: Secondary | ICD-10-CM | POA: Diagnosis not present

## 2024-01-15 MED ORDER — CYCLOBENZAPRINE HCL 10 MG PO TABS: 10.0000 mg | ORAL_TABLET | Freq: Two times a day (BID) | ORAL | 0 refills | Status: AC | PRN

## 2024-01-15 NOTE — Discharge Instructions (Addendum)
Take ibuprofen as needed for discomfort.  Take the muscle relaxer as needed for muscle spasm; Do not drive, operate machinery, or drink alcohol with this medication as it can cause drowsiness.   Follow up with your primary care provider or an orthopedist if your symptoms are not improving.

## 2024-01-15 NOTE — ED Triage Notes (Signed)
 Patient to Urgent Care with complaints of right sided low back and hip pain radiating down his leg.   Symptoms started over a month ago. Using ice/ heat/ biofreeze/ completed prednisone  and muscle relaxer from pcp. Hasn't felt much relief.

## 2024-01-15 NOTE — ED Provider Notes (Signed)
 [facility]    CSN: [id number] Arrival date & time: 01/15/24  1253      History   Chief Complaint Chief Complaint  Patient presents with   Hip Pain    HPI Bill Hernandez is a 53 y.o. male.  Patient presents with 4-5 week history of right lower back pain which radiates down his right leg.  No falls or injury.  The pain is worse with weightbearing and ambulation; improves with rest.  He took methocarbamol  this morning without relief.  He denies numbness, weakness, saddle anesthesia, loss of bowel/bladder control, redness, bruising, rash, wound, fever, abdominal pain, dysuria, hematuria.  Patient was seen by his PCP on 01/04/2024 for acute right low back pain with right side sciatica; Toradol  injection given; treated with prednisone  and methocarbamol .  The history is provided by the patient and medical records.    Past Medical History:  Diagnosis Date   Elevated BP without diagnosis of hypertension 03/16/2017   Elevated low density lipoprotein (LDL) cholesterol level 10/30/2016   Tobacco abuse 09/28/2016    Patient Active Problem List   Diagnosis Date Noted   DM (diabetes mellitus), type 2 (HCC) 01/18/2021   Overweight with body mass index (BMI) of 26 to 26.9 in adult 01/18/2021   Hyperlipidemia associated with type 2 diabetes mellitus (HCC) 10/30/2016    Past Surgical History:  Procedure Laterality Date   COLONOSCOPY WITH PROPOFOL  N/A 11/26/2020   Procedure: COLONOSCOPY WITH PROPOFOL ;  Surgeon: Janalyn Keene NOVAK, MD;  Location: [facility] ENDOSCOPY;  Service: Endoscopy;  Laterality: N/A;   NO PAST SURGERIES         Home Medications    Prior to Admission medications   Medication Sig Start Date End Date Taking? Authorizing Provider  cyclobenzaprine  (FLEXERIL ) 10 MG tablet Take 1 tablet (10 mg total) by mouth 2 (two) times daily as needed for muscle spasms. 01/15/24  Yes Corlis Burnard DEL, NP  atorvastatin  (LIPITOR) 20 MG tablet Take 1 tablet (20 mg total) by mouth  daily. Patient not taking: Reported on 01/04/2024 01/19/21   Antonette Angeline ORN, NP    Family History Family History  Problem Relation Age of Onset   Hypertension Mother    Hyperlipidemia Mother     Social History Social History   Tobacco Use   Smoking status: Every Day    Current packs/day: 0.25    Types: Cigarettes   Smokeless tobacco: Never   Tobacco comments:    Down to 3-4 cigarettes per day   Vaping Use   Vaping status: Never Used  Substance Use Topics   Alcohol use: Yes    Alcohol/week: 2.0 - 3.0 standard drinks of alcohol    Types: 2 - 3 Cans of beer per week   Drug use: Not Currently    Types: Marijuana    Comment: 1 week ago     Allergies   Patient has no known allergies.   Review of Systems Review of Systems  Constitutional:  Negative for chills and fever.  Gastrointestinal:  Negative for abdominal pain, constipation, diarrhea, nausea and vomiting.  Genitourinary:  Negative for dysuria and hematuria.  Musculoskeletal:  Positive for back pain and gait problem. Negative for arthralgias and joint swelling.  Skin:  Negative for color change, rash and wound.  Neurological:  Negative for weakness and numbness.     Physical Exam Triage Vital Signs ED Triage Vitals  Encounter Vitals Group     BP      Girls Systolic BP Percentile  Girls Diastolic BP Percentile      Boys Systolic BP Percentile      Boys Diastolic BP Percentile      Pulse      Resp      Temp      Temp src      SpO2      Weight      Height      Head Circumference      Peak Flow      Pain Score      Pain Loc      Pain Education      Exclude from Growth Chart    No data found.  Updated Vital Signs BP (!) 148/83   Pulse 97   Temp (!) 97.4 F (36.3 C)   Resp 18   SpO2 98%   Visual Acuity Right Eye Distance:   Left Eye Distance:   Bilateral Distance:    Right Eye Near:   Left Eye Near:    Bilateral Near:     Physical Exam Constitutional:      General: He is not in  acute distress. HENT:     Mouth/Throat:     Mouth: Mucous membranes are moist.  Cardiovascular:     Rate and Rhythm: Normal rate and regular rhythm.  Pulmonary:     Effort: Pulmonary effort is normal. No respiratory distress.  Abdominal:     General: Bowel sounds are normal.     Palpations: Abdomen is soft.     Tenderness: There is no abdominal tenderness. There is no right CVA tenderness, left CVA tenderness, guarding or rebound.  Musculoskeletal:        General: Tenderness present. No swelling or deformity. Normal range of motion.       Back:  Skin:    Capillary Refill: Capillary refill takes less than 2 seconds.     Findings: No bruising, erythema, lesion or rash.  Neurological:     General: No focal deficit present.     Mental Status: He is alert.     Sensory: No sensory deficit.     Motor: No weakness.     Gait: Gait abnormal.      UC Treatments / Results  Labs (all labs ordered are listed, but only abnormal results are displayed) Labs Reviewed - No data to display  EKG   Radiology DG Lumbar Spine Complete Result Date: 01/15/2024 CLINICAL DATA:  Right-sided back pain radiating down the leg. EXAM: LUMBAR SPINE - COMPLETE 4+ VIEW COMPARISON:  None Available. FINDINGS: 5 nonrib bearing lumbar-type vertebral bodies. Vertebral body heights are maintained. No acute fracture. No static listhesis. Mild anterior endplate osteophytosis at L1-L2, L3-L4 and L5-S1. SI joints are unremarkable. IMPRESSION: 1. No acute fracture or static listhesis of the lumbar spine. 2. Mild multilevel degenerative changes of the lumbar spine. Electronically Signed   By: Harrietta Sherry M.D.   On: 01/15/2024 13:50    Procedures Procedures (including critical care time)  Medications Ordered in UC Medications - No data to display  Initial Impression / Assessment and Plan / UC Course  I have reviewed the triage vital signs and the nursing notes.  Pertinent labs & imaging results that were  available during my care of the patient were reviewed by me and considered in my medical decision making (see chart for details).    Acute right lower back pain with right side sciatica.  X-ray of lumbar spine shows no acute bony abnormality.  Instructed patient to take  Tylenol  or ibuprofen as needed.  Instructed him to discontinue the methocarbamol  and start cyclobenzaprine  as directed.  Precautions for drowsiness with cyclobenzaprine  discussed.  Education provided on sciatica.  Instructed patient to follow-up with his PCP or an orthopedist.  Contact information for on-call Ortho provided.  ED precautions given.  Patient agrees to plan of care.  Final Clinical Impressions(s) / UC Diagnoses   Final diagnoses:  Acute right-sided low back pain with right-sided sciatica     Discharge Instructions      Take ibuprofen as needed for discomfort.  Take the muscle relaxer as needed for muscle spasm; Do not drive, operate machinery, or drink alcohol with this medication as it can cause drowsiness.   Follow up with your primary care provider or an orthopedist if your symptoms are not improving.       ED Prescriptions     Medication Sig Dispense Auth. Provider   cyclobenzaprine  (FLEXERIL ) 10 MG tablet Take 1 tablet (10 mg total) by mouth 2 (two) times daily as needed for muscle spasms. 20 tablet Corlis Burnard DEL, NP      I have reviewed the PDMP during this encounter.   Corlis Burnard DEL, NP 01/15/24 [phone]

## 2024-01-25 ENCOUNTER — Ambulatory Visit: Payer: Self-pay

## 2024-01-25 ENCOUNTER — Encounter: Admitting: Internal Medicine

## 2024-01-25 NOTE — Progress Notes (Deleted)
 Subjective:    Patient ID: Bill Hernandez, male    DOB: [birth year], 53 y.o.   MRN: [id number]  HPI  Patient presents to clinic today for his annual exam.  Flu: 04/2023 Tetanus: 10/2017 COVID: X 3 Pneumovax: Never Shingrix: Never PSA screening: 10/2020 Colon screening: 11/2020 Vision screening: Dentist:  Diet: Exercise:   Review of Systems   Past Medical History:  Diagnosis Date   Elevated BP without diagnosis of hypertension 03/16/2017   Elevated low density lipoprotein (LDL) cholesterol level 10/30/2016   Tobacco abuse 09/28/2016    Current Outpatient Medications  Medication Sig Dispense Refill   atorvastatin  (LIPITOR) 20 MG tablet Take 1 tablet (20 mg total) by mouth daily. (Patient not taking: Reported on 01/04/2024) 90 tablet 3   cyclobenzaprine  (FLEXERIL ) 10 MG tablet Take 1 tablet (10 mg total) by mouth 2 (two) times daily as needed for muscle spasms. 20 tablet 0   No current facility-administered medications for this visit.    No Known Allergies  Family History  Problem Relation Age of Onset   Hypertension Mother    Hyperlipidemia Mother     Social History   Socioeconomic History   Marital status: Single    Spouse name: Not on file   Number of children: Not on file   Years of education: Not on file   Highest education level: 12th grade  Occupational History   Not on file  Tobacco Use   Smoking status: Every Day    Current packs/day: 0.25    Types: Cigarettes   Smokeless tobacco: Never   Tobacco comments:    Down to 3-4 cigarettes per day   Vaping Use   Vaping status: Never Used  Substance and Sexual Activity   Alcohol use: Yes    Alcohol/week: 2.0 - 3.0 standard drinks of alcohol    Types: 2 - 3 Cans of beer per week   Drug use: Not Currently    Types: Marijuana    Comment: 1 week ago   Sexual activity: Yes    Birth control/protection: Condom    Comment: not consistently using  Other Topics Concern   Not on file  Social History  Narrative   Not on file   Social Drivers of Health   Financial Resource Strain: Medium Risk (04/09/2023)   Overall Financial Resource Strain (CARDIA)    Difficulty of Paying Living Expenses: Somewhat hard  Food Insecurity: Food Insecurity Present (04/09/2023)   Hunger Vital Sign    Worried About Running Out of Food in the Last Year: Sometimes true    Ran Out of Food in the Last Year: Sometimes true  Transportation Needs: No Transportation Needs (04/09/2023)   PRAPARE - Administrator, Civil Service (Medical): No    Lack of Transportation (Non-Medical): No  Physical Activity: Insufficiently Active (04/09/2023)   Exercise Vital Sign    Days of Exercise per Week: 3 days    Minutes of Exercise per Session: 40 min  Stress: No Stress Concern Present (04/09/2023)   [employer] of Occupational Health - Occupational Stress Questionnaire    Feeling of Stress : Not at all  Social Connections: Socially Isolated (04/09/2023)   Social Connection and Isolation Panel    Frequency of Communication with Friends and Family: More than three times a week    Frequency of Social Gatherings with Friends and Family: More than three times a week    Attends Religious Services: Never    Database administrator or  Organizations: No    Attends Engineer, structural: Not on file    Marital Status: Divorced  Intimate Partner Violence: Not on file     Constitutional: Denies fever, malaise, fatigue, headache or abrupt weight changes.  HEENT: Denies eye pain, eye redness, ear pain, ringing in the ears, wax buildup, runny nose, nasal congestion, bloody nose, or sore throat. Respiratory: Denies difficulty breathing, shortness of breath, cough or sputum production.   Cardiovascular: Denies chest pain, chest tightness, palpitations or swelling in the hands or feet.  Gastrointestinal: Denies abdominal pain, bloating, constipation, diarrhea or blood in the stool.  GU: Denies urgency, frequency, pain  with urination, burning sensation, blood in urine, odor or discharge. Musculoskeletal: Denies decrease in range of motion, difficulty with gait, muscle pain or joint pain and swelling.  Skin: Denies redness, rashes, lesions or ulcercations.  Neurological: Denies dizziness, difficulty with memory, difficulty with speech or problems with balance and coordination.  Psych: Denies anxiety, depression, SI/HI.  No other specific complaints in a complete review of systems (except as listed in HPI above).      Objective:   Physical Exam  There were no vitals taken for this visit. Wt Readings from Last 3 Encounters:  01/04/24 190 lb (86.2 kg)  04/13/23 198 lb (89.8 kg)  01/17/21 219 lb 12.8 oz (99.7 kg)    General: Appears their stated age, well developed, well nourished in NAD. Skin: Warm, dry and intact. No rashes, lesions or ulcerations noted. HEENT: Head: normal shape and size; Eyes: sclera white, no icterus, conjunctiva pink, PERRLA and EOMs intact; Ears: Tm's gray and intact, normal light reflex; Nose: mucosa pink and moist, septum midline; Throat/Mouth: Teeth present, mucosa pink and moist, no exudate, lesions or ulcerations noted.  Neck:  Neck supple, trachea midline. No masses, lumps or thyromegaly present.  Cardiovascular: Normal rate and rhythm. S1,S2 noted.  No murmur, rubs or gallops noted. No JVD or BLE edema. No carotid bruits noted. Pulmonary/Chest: Normal effort and positive vesicular breath sounds. No respiratory distress. No wheezes, rales or ronchi noted.  Abdomen: Soft and nontender. Normal bowel sounds. No distention or masses noted. Liver, spleen and kidneys non palpable. Musculoskeletal: Normal range of motion. No signs of joint swelling. No difficulty with gait.  Neurological: Alert and oriented. Cranial nerves II-XII grossly intact. Coordination normal.  Psychiatric: Mood and affect normal. Behavior is normal. Judgment and thought content normal.    BMET    Component  Value Date/Time   NA 136 01/17/2021 1612   K 3.9 01/17/2021 1612   CL 103 01/17/2021 1612   CO2 24 01/17/2021 1612   GLUCOSE 101 01/17/2021 1612   BUN 12 01/17/2021 1612   CREATININE 1.22 01/17/2021 1612   CALCIUM  9.7 01/17/2021 1612   GFRNONAA 75 10/17/2017 0922   GFRAA 87 10/17/2017 0922    Lipid Panel     Component Value Date/Time   CHOL 245 (H) 01/17/2021 1612   TRIG 508 (H) 01/17/2021 1612   HDL 38 (L) 01/17/2021 1612   CHOLHDL 6.4 (H) 01/17/2021 1612   VLDL 30 09/28/2016 0001   LDLCALC  01/17/2021 1612     Comment:     . LDL cholesterol not calculated. Triglyceride levels greater than 400 mg/dL invalidate calculated LDL results. . Reference range: <100 . Desirable range <100 mg/dL for primary prevention;   <70 mg/dL for patients with CHD or diabetic patients  with > or = 2 CHD risk factors. SABRA LDL-C is now calculated using the Martin-Hopkins  calculation, which is a validated novel method providing  better accuracy than the Friedewald equation in the  estimation of LDL-C.  Gladis APPLETHWAITE et al. SANDREA. 7986;689(80): 2061-2068  (http://education.[facility].com/faq/FAQ164)     CBC    Component Value Date/Time   WBC 7.7 10/08/2020 0756   RBC 5.73 10/08/2020 0756   HGB 15.4 10/08/2020 0756   HCT 46.5 10/08/2020 0756   PLT 218 10/08/2020 0756   MCV 81.2 10/08/2020 0756   MCH 26.9 (L) 10/08/2020 0756   MCHC 33.1 10/08/2020 0756   RDW 12.4 10/08/2020 0756   LYMPHSABS 2,187 10/08/2020 0756   MONOABS 609 09/28/2016 0001   EOSABS 131 10/08/2020 0756   BASOSABS 23 10/08/2020 0756    Hgb A1C Lab Results  Component Value Date   HGBA1C 6.0 (A) 04/13/2023            Assessment & Plan:   Preventative health maintenance:  Encouraged him to get a flu shot in the fall Tetanus UTD Encouraged him to get his COVID booster Prevnar 20 Discussed Shingrix vaccine, he will check coverage with his insurance company and schedule a visit if he would like to have  this done Referral to GI for screening colonoscopy Encouraged him to consume a balanced diet and exercise regimen Advised him to see an eye doctor and dentist annually We will check CBC, c-Met, lipid, PSA, A1c and urine microalbumin today  RTC in 6 months, follow-up chronic conditions Angeline Laura, NP

## 2024-01-25 NOTE — Telephone Encounter (Signed)
 FYI Only or Action Required?: FYI only for provider.  Patient was last seen in primary care on 01/04/2024 by Antonette Angeline ORN, NP.  Called Nurse Triage reporting Pain.  Symptoms began about a month ago.  Interventions attempted: Other: pain medication, pain/steroid injection.  Symptoms are: unchanged.  Triage Disposition: See PCP When Office is Open (Within 3 Days)  Patient/caregiver understands and will follow disposition?: Yes    Copied from CRM [id number]. Topic: Clinical - Red Word Triage >> Jan 25, 2024 11:35 AM Tobias CROME wrote: Red Word that prompted transfer to Nurse Triage: sciatic nerve pain , needing appt Reason for Disposition  [1] MODERATE back pain (e.g., interferes with normal activities) AND [2] present > 3 days    Back/right hip pain that radiates down leg with numbness & tingling  Answer Assessment - Initial Assessment Questions 1. ONSET: When did the pain begin? (e.g., minutes, hours, days)     X month 2. LOCATION: Where does it hurt? (upper, mid or lower back)     Right hip  3. SEVERITY: How bad is the pain?  (e.g., Scale 1-10; mild, moderate, or severe)     9 or 10/10 4. PATTERN: Is the pain constant? (e.g., yes, no; constant, intermittent)      constant 5. RADIATION: Does the pain shoot into your legs or somewhere else?     Down right leg 6. CAUSE:  What do you think is causing the back pain?      Sciatic nerve 7. BACK OVERUSE:  Any recent lifting of heavy objects, strenuous work or exercise?     na 8. MEDICINES: What have you taken so far for the pain? (e.g., nothing, acetaminophen , NSAIDS)     na 9. NEUROLOGIC SYMPTOMS: Do you have any weakness, numbness, or problems with bowel/bladder control?     Numbness and tingling 10. OTHER SYMPTOMS: Do you have any other symptoms? (e.g., fever, abdomen pain, burning with urination, blood in urine)       no 11. PREGNANCY: Is there any chance you are pregnant? When was your last menstrual  period?       na  Protocols used: Back Pain-A-AH

## 2024-01-25 NOTE — Telephone Encounter (Signed)
 Will discuss at upcoming appt, although he had an appt today that he no showed.

## 2024-01-31 NOTE — Progress Notes (Deleted)
 Subjective:    Patient ID: Bill Hernandez, male    DOB: 1970-07-28, 53 y.o.   MRN: [id number]  HPI  Patient presents to clinic today for follow-up of chronic conditions.  DM2: His last A1c was 6%, 04/2023.  He is not taking any oral diabetic medication at this time.  He does not check his sugars.  He does not check his feet routinely.  His last eye exam was > 1 year ago.  Flu 04/2023.  Pneumovax never.  COVID x 3.  HLD: His last LDL was not calculated, triglycerides 508, 01/2021.  He is not taking atorvastatin  as prescribed.  He does not consume a low-fat diet.  Review of Systems  Past Medical History:  Diagnosis Date   Elevated BP without diagnosis of hypertension 03/16/2017   Elevated low density lipoprotein (LDL) cholesterol level 10/30/2016   Tobacco abuse 09/28/2016    Current Outpatient Medications  Medication Sig Dispense Refill   atorvastatin  (LIPITOR) 20 MG tablet Take 1 tablet (20 mg total) by mouth daily. (Patient not taking: Reported on 01/04/2024) 90 tablet 3   cyclobenzaprine  (FLEXERIL ) 10 MG tablet Take 1 tablet (10 mg total) by mouth 2 (two) times daily as needed for muscle spasms. 20 tablet 0   No current facility-administered medications for this visit.    No Known Allergies  Family History  Problem Relation Age of Onset   Hypertension Mother    Hyperlipidemia Mother     Social History   Socioeconomic History   Marital status: Single    Spouse name: Not on file   Number of children: Not on file   Years of education: Not on file   Highest education level: 12th grade  Occupational History   Not on file  Tobacco Use   Smoking status: Every Day    Current packs/day: 0.25    Types: Cigarettes   Smokeless tobacco: Never   Tobacco comments:    Down to 3-4 cigarettes per day   Vaping Use   Vaping status: Never Used  Substance and Sexual Activity   Alcohol use: Yes    Alcohol/week: 2.0 - 3.0 standard drinks of alcohol    Types: 2 - 3 Cans of beer  per week   Drug use: Not Currently    Types: Marijuana    Comment: 1 week ago   Sexual activity: Yes    Birth control/protection: Condom    Comment: not consistently using  Other Topics Concern   Not on file  Social History Narrative   Not on file   Social Drivers of Health   Financial Resource Strain: Medium Risk (04/09/2023)   Overall Financial Resource Strain (CARDIA)    Difficulty of Paying Living Expenses: Somewhat hard  Food Insecurity: Food Insecurity Present (04/09/2023)   Hunger Vital Sign    Worried About Running Out of Food in the Last Year: Sometimes true    Ran Out of Food in the Last Year: Sometimes true  Transportation Needs: No Transportation Needs (04/09/2023)   PRAPARE - Administrator, Civil Service (Medical): No    Lack of Transportation (Non-Medical): No  Physical Activity: Insufficiently Active (04/09/2023)   Exercise Vital Sign    Days of Exercise per Week: 3 days    Minutes of Exercise per Session: 40 min  Stress: No Stress Concern Present (04/09/2023)   [employer] of Occupational Health - Occupational Stress Questionnaire    Feeling of Stress : Not at all  Social Connections: Socially  Isolated (04/09/2023)   Social Connection and Isolation Panel    Frequency of Communication with Friends and Family: More than three times a week    Frequency of Social Gatherings with Friends and Family: More than three times a week    Attends Religious Services: Never    Database administrator or Organizations: No    Attends Engineer, structural: Not on file    Marital Status: Divorced  Intimate Partner Violence: Not on file     Constitutional: Denies fever, malaise, fatigue, headache or abrupt weight changes.  HEENT: Denies eye pain, eye redness, ear pain, ringing in the ears, wax buildup, runny nose, nasal congestion, bloody nose, or sore throat. Respiratory: Denies difficulty breathing, shortness of breath, cough or sputum production.    Cardiovascular: Denies chest pain, chest tightness, palpitations or swelling in the hands or feet.  Gastrointestinal: Denies abdominal pain, bloating, constipation, diarrhea or blood in the stool.  GU: Denies urgency, frequency, pain with urination, burning sensation, blood in urine, odor or discharge. Musculoskeletal: Patient reports low back pain.  Denies decrease in range of motion, difficulty with gait, muscle pain or joint pain and swelling.  Skin: Denies redness, rashes, lesions or ulcercations.  Neurological: Denies dizziness, difficulty with memory, difficulty with speech or problems with balance and coordination.  Psych: Denies anxiety, depression, SI/HI.  No other specific complaints in a complete review of systems (except as listed in HPI above).     Objective:   Physical Exam  There were no vitals taken for this visit.  Wt Readings from Last 3 Encounters:  01/04/24 190 lb (86.2 kg)  04/13/23 198 lb (89.8 kg)  01/17/21 219 lb 12.8 oz (99.7 kg)    General: Appears his stated age, overweight, in NAD. Skin: Warm, dry and intact. No ulcerations noted. HEENT: Head: normal shape and size; Eyes: sclera white, no icterus, conjunctiva pink, PERRLA and EOMs intact;  Neck:  Neck supple, trachea midline. No masses, lumps or thyromegaly present.  Cardiovascular: Normal rate and rhythm. S1,S2 noted.  No murmur, rubs or gallops noted. No JVD or BLE edema. No carotid bruits noted. Pulmonary/Chest: Normal effort and positive vesicular breath sounds. No respiratory distress. No wheezes, rales or ronchi noted.  Neurological: Alert and oriented. Coordination normal.    BMET    Component Value Date/Time   NA 136 01/17/2021 1612   K 3.9 01/17/2021 1612   CL 103 01/17/2021 1612   CO2 24 01/17/2021 1612   GLUCOSE 101 01/17/2021 1612   BUN 12 01/17/2021 1612   CREATININE 1.22 01/17/2021 1612   CALCIUM  9.7 01/17/2021 1612   GFRNONAA 75 10/17/2017 0922   GFRAA 87 10/17/2017 0922     Lipid Panel     Component Value Date/Time   CHOL 245 (H) 01/17/2021 1612   TRIG 508 (H) 01/17/2021 1612   HDL 38 (L) 01/17/2021 1612   CHOLHDL 6.4 (H) 01/17/2021 1612   VLDL 30 09/28/2016 0001   LDLCALC  01/17/2021 1612     Comment:     . LDL cholesterol not calculated. Triglyceride levels greater than 400 mg/dL invalidate calculated LDL results. . Reference range: <100 . Desirable range <100 mg/dL for primary prevention;   <70 mg/dL for patients with CHD or diabetic patients  with > or = 2 CHD risk factors. SABRA LDL-C is now calculated using the Martin-Hopkins  calculation, which is a validated novel method providing  better accuracy than the Friedewald equation in the  estimation of LDL-C.  Gladis APPLETHWAITE et al. SANDREA. 7986;689(80): 2061-2068  (http://education.[facility].com/faq/FAQ164)     CBC    Component Value Date/Time   WBC 7.7 10/08/2020 0756   RBC 5.73 10/08/2020 0756   HGB 15.4 10/08/2020 0756   HCT 46.5 10/08/2020 0756   PLT 218 10/08/2020 0756   MCV 81.2 10/08/2020 0756   MCH 26.9 (L) 10/08/2020 0756   MCHC 33.1 10/08/2020 0756   RDW 12.4 10/08/2020 0756   LYMPHSABS 2,187 10/08/2020 0756   MONOABS 609 09/28/2016 0001   EOSABS 131 10/08/2020 0756   BASOSABS 23 10/08/2020 0756    Hgb A1C Lab Results  Component Value Date   HGBA1C 6.0 (A) 04/13/2023           Assessment & Plan:      RTC in 6 months for your annual exam Angeline Laura, NP

## 2024-02-01 ENCOUNTER — Ambulatory Visit: Admitting: Internal Medicine
